# Patient Record
Sex: Female | Born: 1995 | Race: White | Hispanic: No | Marital: Married | Smoking: Never smoker
Health system: Southern US, Community
[De-identification: ages and names within clinical notes are randomized; demographics above are authoritative.]

## PROBLEM LIST (undated history)

## (undated) DIAGNOSIS — S82143A Displaced bicondylar fracture of unspecified tibia, initial encounter for closed fracture: Secondary | ICD-10-CM

## (undated) DIAGNOSIS — S32810A Multiple fractures of pelvis with stable disruption of pelvic ring, initial encounter for closed fracture: Secondary | ICD-10-CM

## (undated) DIAGNOSIS — D649 Anemia, unspecified: Secondary | ICD-10-CM

## (undated) DIAGNOSIS — K219 Gastro-esophageal reflux disease without esophagitis: Secondary | ICD-10-CM

## (undated) DIAGNOSIS — S32309A Unspecified fracture of unspecified ilium, initial encounter for closed fracture: Secondary | ICD-10-CM

## (undated) DIAGNOSIS — K5900 Constipation, unspecified: Secondary | ICD-10-CM

## (undated) DIAGNOSIS — S92309A Fracture of unspecified metatarsal bone(s), unspecified foot, initial encounter for closed fracture: Secondary | ICD-10-CM

## (undated) DIAGNOSIS — S83419A Sprain of medial collateral ligament of unspecified knee, initial encounter: Secondary | ICD-10-CM

## (undated) DIAGNOSIS — S83005A Unspecified dislocation of left patella, initial encounter: Secondary | ICD-10-CM

## (undated) HISTORY — DX: Gastro-esophageal reflux disease without esophagitis: K21.9

## (undated) HISTORY — DX: Anemia, unspecified: D64.9

---
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
## 2014-04-18 ENCOUNTER — Encounter (HOSPITAL_COMMUNITY): Payer: Self-pay | Admitting: Emergency Medicine

## 2014-04-18 ENCOUNTER — Emergency Department (INDEPENDENT_AMBULATORY_CARE_PROVIDER_SITE_OTHER)
Admission: EM | Admit: 2014-04-18 | Discharge: 2014-04-18 | Disposition: A | Discharge status: Home / Self Care | Payer: PRIVATE HEALTH INSURANCE | Source: Home / Self Care | Attending: Family Medicine | Admitting: Family Medicine

## 2014-04-18 ENCOUNTER — Ambulatory Visit (HOSPITAL_COMMUNITY): Payer: 59 | Attending: Orthopedic Surgery

## 2014-04-18 DIAGNOSIS — M799 Soft tissue disorder, unspecified: Secondary | ICD-10-CM | POA: Insufficient documentation

## 2014-04-18 DIAGNOSIS — L02216 Cutaneous abscess of umbilicus: Secondary | ICD-10-CM

## 2014-04-18 DIAGNOSIS — L03319 Cellulitis of trunk, unspecified: Secondary | ICD-10-CM

## 2014-04-18 DIAGNOSIS — L02219 Cutaneous abscess of trunk, unspecified: Secondary | ICD-10-CM

## 2014-04-18 LAB — POCT RAPID STREP A: Streptococcus, Group A Screen (Direct): NEGATIVE

## 2014-04-18 NOTE — ED Notes (Signed)
Patient returned to [facility] from [facility]s [facility]

## 2014-04-18 NOTE — Discharge Instructions (Signed)
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
## 2014-04-18 NOTE — ED Notes (Signed)
Sore throat and cough.  S/t for a week, cough for 2 days.   One year ago had a belly button ring placed.  2 days later, ring removed and not put back in since then.  Patient noted scar tissue.  approx one month ago started having swelling.  Parent admits to poking this area, but no drainage.  Area has continued to swell and hurt
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
## 2014-04-19 NOTE — ED Provider Notes (Signed)
Medical screening examination/treatment/procedure(s) were performed by a resident physician or non-physician practitioner and as the supervising physician I was immediately available for consultation/collaboration.  Evan Corey, MD    Evan S Corey, MD 04/19/14 1436 

## 2014-04-20 LAB — CULTURE, GROUP A STREP

## 2014-04-21 LAB — CULTURE, ROUTINE-ABSCESS: Culture: NO GROWTH

## 2015-01-16 ENCOUNTER — Ambulatory Visit: Payer: Self-pay | Admitting: Family Medicine

## 2015-01-16 LAB — URINALYSIS, COMPLETE
GLUCOSE, UR: NEGATIVE
LEUKOCYTE ESTERASE: NEGATIVE
NITRITE: NEGATIVE
Ph: 5.5 (ref 5.0–8.0)
Protein: NEGATIVE
Specific Gravity: 1.025 (ref 1.000–1.030)

## 2015-01-16 LAB — PREGNANCY, URINE: PREGNANCY TEST, URINE: NEGATIVE m[IU]/mL

## 2015-02-27 ENCOUNTER — Encounter: Payer: Self-pay | Admitting: Physician Assistant

## 2015-03-07 ENCOUNTER — Encounter: Payer: Self-pay | Admitting: Physician Assistant

## 2015-03-07 ENCOUNTER — Other Ambulatory Visit: Payer: Self-pay | Admitting: Physician Assistant

## 2015-03-07 ENCOUNTER — Other Ambulatory Visit (INDEPENDENT_AMBULATORY_CARE_PROVIDER_SITE_OTHER): Payer: PRIVATE HEALTH INSURANCE

## 2015-03-07 ENCOUNTER — Ambulatory Visit (INDEPENDENT_AMBULATORY_CARE_PROVIDER_SITE_OTHER): Payer: PRIVATE HEALTH INSURANCE | Admitting: Physician Assistant

## 2015-03-07 VITALS — BP 90/68 | HR 80 | Ht 66.0 in | Wt 120.2 lb

## 2015-03-07 DIAGNOSIS — R110 Nausea: Secondary | ICD-10-CM

## 2015-03-07 DIAGNOSIS — K219 Gastro-esophageal reflux disease without esophagitis: Secondary | ICD-10-CM | POA: Diagnosis not present

## 2015-03-07 DIAGNOSIS — K5900 Constipation, unspecified: Secondary | ICD-10-CM

## 2015-03-07 DIAGNOSIS — D509 Iron deficiency anemia, unspecified: Secondary | ICD-10-CM

## 2015-03-07 DIAGNOSIS — K5909 Other constipation: Secondary | ICD-10-CM

## 2015-03-07 DIAGNOSIS — R634 Abnormal weight loss: Secondary | ICD-10-CM

## 2015-03-07 DIAGNOSIS — E559 Vitamin D deficiency, unspecified: Secondary | ICD-10-CM

## 2015-03-07 LAB — SEDIMENTATION RATE: Sed Rate: 12 mm/hr (ref 0–22)

## 2015-03-07 LAB — HIGH SENSITIVITY CRP: CRP HIGH SENSITIVITY: 0.99 mg/L (ref 0.000–5.000)

## 2015-03-07 MED ORDER — PANTOPRAZOLE SODIUM 40 MG PO TBEC: 40.0000 mg | DELAYED_RELEASE_TABLET | Freq: Every day | ORAL | Status: DC
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
## 2015-03-07 NOTE — Patient Instructions (Addendum)
Please go to the basement level to have your labs drawn and Stool test kit.   Continue Miralax, take 1-2 doses daily. We sent a prescription for Pantoprazole sodium 40 mg to  [facility].   You have been scheduled for an endoscopy. Please follow written instructions given to you at your visit today. If you use inhalers (even only as needed), please bring them with you on the day of your procedure. Your physician has requested that you go to www.startemmi.com and enter the access code given to you at your visit today. This web site gives a general overview about your procedure. However, you should still follow specific instructions given to you by our office regarding your preparation for the procedure.

## 2015-03-07 NOTE — Progress Notes (Signed)
Reviewed and agree with management plan.  Trysten Berti T. Edina Winningham, MD FACG 

## 2015-03-08 ENCOUNTER — Other Ambulatory Visit: Payer: Self-pay | Admitting: *Deleted

## 2015-03-08 ENCOUNTER — Other Ambulatory Visit: Payer: Self-pay

## 2015-03-08 ENCOUNTER — Other Ambulatory Visit (INDEPENDENT_AMBULATORY_CARE_PROVIDER_SITE_OTHER): Payer: PRIVATE HEALTH INSURANCE

## 2015-03-08 ENCOUNTER — Other Ambulatory Visit: Payer: Self-pay | Admitting: Gastroenterology

## 2015-03-08 ENCOUNTER — Encounter: Payer: Self-pay | Admitting: Gastroenterology

## 2015-03-08 ENCOUNTER — Ambulatory Visit (AMBULATORY_SURGERY_CENTER): Payer: PRIVATE HEALTH INSURANCE | Admitting: Gastroenterology

## 2015-03-08 VITALS — BP 112/57 | HR 83 | Temp 98.3°F | Resp 16 | Ht 66.0 in | Wt 120.0 lb

## 2015-03-08 DIAGNOSIS — R197 Diarrhea, unspecified: Secondary | ICD-10-CM | POA: Diagnosis not present

## 2015-03-08 DIAGNOSIS — R634 Abnormal weight loss: Secondary | ICD-10-CM | POA: Diagnosis not present

## 2015-03-08 DIAGNOSIS — K900 Celiac disease: Secondary | ICD-10-CM

## 2015-03-08 DIAGNOSIS — D509 Iron deficiency anemia, unspecified: Secondary | ICD-10-CM

## 2015-03-08 DIAGNOSIS — K219 Gastro-esophageal reflux disease without esophagitis: Secondary | ICD-10-CM | POA: Diagnosis not present

## 2015-03-08 DIAGNOSIS — R112 Nausea with vomiting, unspecified: Secondary | ICD-10-CM | POA: Diagnosis not present

## 2015-03-08 LAB — H. PYLORI ANTIBODY, IGG: H Pylori IgG: NEGATIVE

## 2015-03-08 LAB — CELIAC PANEL 10
ENDOMYSIAL SCREEN: NEGATIVE
GLIADIN IGG: 2 U (ref <20)
Gliadin IgA: 5 Units (ref <20)
IGA: 143 mg/dL (ref 69–380)
TISSUE TRANSGLUTAMINASE AB, IGA: 1 U/mL (ref <4)
Tissue Transglut Ab: 2 U/mL (ref <6)

## 2015-03-08 MED ORDER — SODIUM CHLORIDE 0.9 % IV SOLN: 500.0000 mL | INTRAVENOUS | Status: DC

## 2015-03-08 NOTE — Progress Notes (Signed)
Called to room to assist during endoscopic procedure.  Patient ID and intended procedure confirmed with present staff. Received instructions for my participation in the procedure from the performing physician.  

## 2015-03-08 NOTE — Patient Instructions (Addendum)
YOU HAD AN ENDOSCOPIC PROCEDURE TODAY AT THE [facility]:   Refer to the procedure report that was given to you for any specific questions about what was found during the examination.  If the procedure report does not answer your questions, please call your gastroenterologist to clarify.  If you requested that your care partner not be given the details of your procedure findings, then the procedure report has been included in a sealed envelope for you to review at your convenience later.  YOU SHOULD EXPECT: Some feelings of bloating in the abdomen. Passage of more gas than usual.  Walking can help get rid of the air that was put into your GI tract during the procedure and reduce the bloating. If you had a lower endoscopy (such as a colonoscopy or flexible sigmoidoscopy) you may notice spotting of blood in your stool or on the toilet paper. If you underwent a bowel prep for your procedure, you may not have a normal bowel movement for a few days.  Please Note:  You might notice some irritation and congestion in your nose or some drainage.  This is from the oxygen used during your procedure.  There is no need for concern and it should clear up in a day or so.  SYMPTOMS TO REPORT IMMEDIATELY:   Following upper endoscopy (EGD)  Vomiting of blood or coffee ground material  New chest pain or pain under the shoulder blades  Painful or persistently difficult swallowing  New shortness of breath  Fever of 100F or higher  Black, tarry-looking stools  For urgent or emergent issues, a gastroenterologist can be reached at any hour by calling [phone].   DIET: Your first meal following the procedure should be a small meal and then it is ok to progress to your normal diet. Heavy or fried foods are harder to digest and may make you feel nauseous or bloated.  Likewise, meals heavy in dairy and vegetables can increase bloating.  Drink plenty of fluids but you should avoid alcoholic beverages for  24 hours.  ACTIVITY:  You should plan to take it easy for the rest of today and you should NOT DRIVE or use heavy machinery until tomorrow (because of the sedation medicines used during the test).    FOLLOW UP: Our staff will call the number listed on your records the next business day following your procedure to check on you and address any questions or concerns that you may have regarding the information given to you following your procedure. If we do not reach you, we will leave a message.  However, if you are feeling well and you are not experiencing any problems, there is no need to return our call.  We will assume that you have returned to your regular daily activities without incident.  If any biopsies were taken you will be contacted by phone or by letter within the next 1-3 weeks.  Please call us at [phone] if you have not heard about the biopsies in 3 weeks.    SIGNATURES/CONFIDENTIALITY: You and/or your care partner have signed paperwork which will be entered into your electronic medical record.  These signatures attest to the fact that that the information above on your After Visit Summary has been reviewed and is understood.  Full responsibility of the confidentiality of this discharge information lies with you and/or your care-partner.  Gastritis-handout given  Office follow-up in 2-3 weeks, office will call to schedule appointment.  Continue protonix and iron.

## 2015-03-08 NOTE — Op Note (Signed)
[facility] [address] [place], [id number]   ENDOSCOPY PROCEDURE REPORT  PATIENT: Megan [name], Megan [name]  MR#: [id number] BIRTHDATE: 12-Aug-1996 , 18  yrs. old GENDER: female ENDOSCOPIST: Meryl DareMalcolm T Nyzier Boivin, MD, [facility] REFERRED BY:  Baruch GoutyMelinda Lada, MD PROCEDURE DATE:  03/08/2015 PROCEDURE:  EGD w/ biopsy ASA CLASS:     Class II INDICATIONS:  iron deficiency anemia, nausea, vomiting, history of esophageal reflux, and weight loss. MEDICATIONS: Monitored anesthesia care and Propofol 200 mg IV TOPICAL ANESTHETIC: none DESCRIPTION OF PROCEDURE: After the risks benefits and alternatives of the procedure were thoroughly explained, informed consent was obtained.  The LB [id number] endoscope was introduced through the mouth and advanced to the second portion of the duodenum , Without limitations.  The instrument was slowly withdrawn as the mucosa was fully examined.    DUODENUM: Moderate erosive duodenitis was found in the duodenal bulb.  Multiple random biopsies were performed in bulb and descending duodenum. STOMACH: Mild gastritis was found in the gastric body.  Multiple biopsies were performed.   The stomach otherwise appeared normal.  ESOPHAGUS: The mucosa of the esophagus appeared normal.  Retroflexed views revealed no abnormalities.     The scope was then withdrawn from the patient and the procedure completed.  COMPLICATIONS: There were no immediate complications.  ENDOSCOPIC IMPRESSION: 1.   Erosive duodenitis in the duodenal bulb 2.   Mild gastritis in the gastric body; multiple biopsies performed  3.   The EGD otherwise appeared normal  RECOMMENDATIONS: 1.  Anti-reflux regimen 2.  Await pathology 3.  Continue PPI qam and Fe daily 4.  OP follow-up in 2-3 weeks.  eSigned:  Meryl DareMalcolm T Araly Kaas, MD, [facility] 03/08/2015 10:28 AM   [C

## 2015-03-08 NOTE — Progress Notes (Signed)
Report to PACU, RN, vss, BBS= Clear.  

## 2015-03-09 ENCOUNTER — Telehealth: Payer: Self-pay | Admitting: *Deleted

## 2015-03-09 NOTE — Telephone Encounter (Signed)
  Follow up Call-  Call back number 03/08/2015  Post procedure Call Back phone  # [phone]  Permission to leave phone message Yes     Patient questions:  Do you have a fever, pain , or abdominal swelling? No. Pain Score  0 *  Have you tolerated food without any problems? Yes.    Have you been able to return to your normal activities? Yes.    Do you have any questions about your discharge instructions: Diet   No. Medications  No. Follow up visit  No.  Do you have questions or concerns about your Care? No.  Actions: * If pain score is 4 or above: No action needed, pain <4.

## 2015-03-14 ENCOUNTER — Encounter: Payer: Self-pay | Admitting: Gastroenterology

## 2015-04-06 ENCOUNTER — Ambulatory Visit: Payer: PRIVATE HEALTH INSURANCE | Admitting: Gastroenterology

## 2015-04-18 DIAGNOSIS — D649 Anemia, unspecified: Secondary | ICD-10-CM | POA: Insufficient documentation

## 2015-04-18 DIAGNOSIS — K219 Gastro-esophageal reflux disease without esophagitis: Secondary | ICD-10-CM | POA: Insufficient documentation

## 2015-04-20 ENCOUNTER — Ambulatory Visit: Payer: Self-pay | Admitting: Family Medicine

## 2015-04-25 ENCOUNTER — Ambulatory Visit (INDEPENDENT_AMBULATORY_CARE_PROVIDER_SITE_OTHER): Payer: PRIVATE HEALTH INSURANCE | Admitting: Family Medicine

## 2015-04-25 ENCOUNTER — Encounter: Payer: Self-pay | Admitting: Family Medicine

## 2015-04-25 VITALS — BP 113/73 | HR 86 | Temp 98.3°F | Wt 119.0 lb

## 2015-04-25 DIAGNOSIS — K2990 Gastroduodenitis, unspecified, without bleeding: Secondary | ICD-10-CM | POA: Diagnosis not present

## 2015-04-25 DIAGNOSIS — J069 Acute upper respiratory infection, unspecified: Secondary | ICD-10-CM

## 2015-04-25 DIAGNOSIS — D649 Anemia, unspecified: Secondary | ICD-10-CM | POA: Diagnosis not present

## 2015-04-25 MED ORDER — MUPIROCIN CALCIUM 2 % EX CREA: 1.0000 "application " | TOPICAL_CREAM | Freq: Two times a day (BID) | CUTANEOUS | Status: DC

## 2015-04-25 NOTE — Assessment & Plan Note (Signed)
Recent egd by GI; continue PPI; avoid triggers, reviewed some of those with her; request labs done

## 2015-04-25 NOTE — Progress Notes (Signed)
BP 113/73 mmHg  Pulse 86  Temp(Src) 98.3 F (36.8 C)  Wt 119 lb (53.978 kg)  SpO2 99%  LMP 03/26/2015 (Approximate)   Subjective:    Patient ID: Megan [name], female    DOB: 02/28/1996, 19 y.o.   MRN: [id number]  HPI: Megan FriendlyCourtney Adrien [name] is a 19 y.o. female  Chief Complaint  Patient presents with  . Anemia  . Sinusitis   She went to the GI doctor and they did an upper endoscopy, did biopsies and okay; but did find gastritis and duodenitis; they did not know why; she goes back in August, first week of August; they thought acid reflux; she was started on Protonix and it's been helping now after several days; they gave her a list of foods to limit; not stressed  Her weight is up three pounds; not as tired, feeling better; moving bowels okay; appetite is good  She had some sinus issues last week; left ear was burning last week; waking up with crusty eyes; sinuses hurting too; beach last week; was in the water; hurts swollen in the cheeks, under the ears; no really sore throat; no meds; no fevers  Relevant past medical, surgical, family and social history reviewed and updated as indicated. Interim medical history since our last visit reviewed. Allergies and medications reviewed and updated.  Review of Systems  Per HPI unless specifically indicated above     Objective:    BP 113/73 mmHg  Pulse 86  Temp(Src) 98.3 F (36.8 C)  Wt 119 lb (53.978 kg)  SpO2 99%  LMP 03/26/2015 (Approximate)  Wt Readings from Last 3 Encounters:  04/25/15 119 lb (53.978 kg) (32 %*, Z = -0.47)  02/27/15 118 lb (53.524 kg) (31 %*, Z = -0.51)  03/08/15 120 lb (54.432 kg) (35 %*, Z = -0.40)   * Growth percentiles are based on CDC 2-20 Years data.    Physical Exam  Constitutional: She appears well-developed and well-nourished. No distress.  HENT:  Head: Normocephalic.  Right Ear: No drainage. Tympanic membrane is not perforated, not erythematous, not retracted and not bulging.   Left Ear: No drainage. Tympanic membrane is not perforated, not erythematous, not retracted and not bulging.  Nose: No mucosal edema or rhinorrhea. No epistaxis.  Eyes: EOM are normal. Right eye exhibits no discharge. Left eye exhibits no discharge.  Neck: Normal range of motion. No thyromegaly present.  Cardiovascular: Normal rate, regular rhythm and normal heart sounds.   Pulmonary/Chest: Effort normal and breath sounds normal.  Abdominal: Soft. Bowel sounds are normal. She exhibits no distension. There is no tenderness. There is no guarding (but ticklish).  Lymphadenopathy:    She has no cervical adenopathy.  Neurological: She is alert.  Skin: Skin is warm and dry. No pallor.    Results for orders placed or performed in visit on 03/08/15  H. pylori antibody, IgG  Result Value Ref Range   H Pylori IgG Negative Negative      Assessment & Plan:   Problem List Items Addressed This Visit      Digestive   Gastritis and duodenitis - Primary    Recent egd by GI; continue PPI; avoid triggers, reviewed some of those with her; request labs done        Other   Anemia    Labs just done by GI per patient; request copy for review and record       Other Visit Diagnoses    Upper respiratory infection  suspect viral; rest and hydration and vit C    Relevant Medications    mupirocin cream (BACTROBAN) 2 %        Follow up plan: Return if symptoms worsen or fail to improve.

## 2015-04-25 NOTE — Assessment & Plan Note (Signed)
Labs just done by GI per patient; request copy for review and record

## 2015-04-25 NOTE — Patient Instructions (Addendum)
Please request labs from the GI doctor so I can review those Do try to limit acidic foods, caffeinated drinks, coffee, peppermint, tea, chocolate, spicy foods Do use the antibiotic ointment inside each nostril twice a day for five days or longer if needed Try vitamin C (orange juice if not diabetic or vitamin C tablets) and drink green tea to help your immune system during your illness Get plenty of rest and hydration Call to see if antibiotics are indicated if still congested and symptomatic after 10 days, or if you develop fever

## 2015-05-21 ENCOUNTER — Ambulatory Visit (INDEPENDENT_AMBULATORY_CARE_PROVIDER_SITE_OTHER): Payer: PRIVATE HEALTH INSURANCE | Admitting: Gastroenterology

## 2015-05-21 ENCOUNTER — Encounter: Payer: Self-pay | Admitting: Gastroenterology

## 2015-05-21 ENCOUNTER — Telehealth: Payer: Self-pay | Admitting: Family Medicine

## 2015-05-21 VITALS — BP 88/60 | HR 76 | Ht 67.0 in | Wt 122.1 lb

## 2015-05-21 DIAGNOSIS — D649 Anemia, unspecified: Secondary | ICD-10-CM

## 2015-05-21 DIAGNOSIS — K2980 Duodenitis without bleeding: Secondary | ICD-10-CM | POA: Diagnosis not present

## 2015-05-21 DIAGNOSIS — K219 Gastro-esophageal reflux disease without esophagitis: Secondary | ICD-10-CM

## 2015-05-21 NOTE — Progress Notes (Signed)
    History of Present Illness: This is a 19 year old female returning in follow-up for iron deficiency anemia, GERD and erosive gastritis. Recent endoscopy with biopsies was performed. Gastric and duodenal biopsies were unremarkable. She has no gastrointestinal complaints.  Current Medications, Allergies, Past Medical History, Past Surgical History, Family History and Social History were reviewed in [employer] record.  Physical Exam: General: Well developed , well nourished, no acute distress Head: Normocephalic and atraumatic Eyes:  sclerae anicteric, EOMI Ears: Normal auditory acuity Mouth: No deformity or lesions Lungs: Clear throughout to auscultation Heart: Regular rate and rhythm; no murmurs, rubs or bruits Abdomen: Soft, non tender and non distended. No masses, hepatosplenomegaly or hernias noted. Normal Bowel sounds Musculoskeletal: Symmetrical with no gross deformities  Extremities: No clubbing, cyanosis, edema or deformities noted Neurological: Alert oriented x 4, grossly nonfocal Psychological:  Alert and cooperative. Normal mood and affect  Assessment and Recommendations:  1. GERD and erosive gastritis. Minimize aspirin and NSAID usage. Continue pantoprazole 40 mg daily and standard antireflux measures.  2. Iron deficiency anemia. No evidence for celiac disease or any other malabsorptive process. Continue iron supplements and follow-up with PCP.  15 minutes of face-to-face time spent with the patient. Greater than 50% of time was spent counseling and coordinating care.

## 2015-05-21 NOTE — Telephone Encounter (Signed)
After looking back in [facility], Dr. Sherie Don had wanted her to come in for CBC and Ferritin in July. Advised patient to continue iron for now and she will come in for labs on Friday and we can see how her iron level is doing and make any changes necessary.

## 2015-05-21 NOTE — Patient Instructions (Addendum)
Stay on your pantoprazole daily.   Thank you for choosing me and [facility].  Venita Lick. Pleas Koch., MD., Clementeen Graham  cc: Baruch Gouty, MD

## 2015-05-21 NOTE — Telephone Encounter (Signed)
Pt called in and needed to know about how long she should take iron supp.

## 2015-05-25 ENCOUNTER — Encounter: Payer: Self-pay | Admitting: Family Medicine

## 2015-05-25 ENCOUNTER — Other Ambulatory Visit: Payer: PRIVATE HEALTH INSURANCE

## 2015-05-25 DIAGNOSIS — D649 Anemia, unspecified: Secondary | ICD-10-CM

## 2015-05-25 LAB — CBC WITH DIFFERENTIAL/PLATELET
HEMATOCRIT: 38 % (ref 34.0–46.6)
HEMOGLOBIN: 13 g/dL (ref 11.1–15.9)
LYMPHS ABS: 1.7 10*3/uL (ref 0.7–3.1)
LYMPHS: 46 %
MCH: 28.4 pg (ref 26.6–33.0)
MCHC: 34.2 g/dL (ref 31.5–35.7)
MCV: 83 fL (ref 79–97)
MID (Absolute): 0.3 10*3/uL (ref 0.1–1.6)
MID: 8 %
Neutrophils Absolute: 1.7 10*3/uL (ref 1.4–7.0)
Neutrophils: 47 %
Platelets: 164 10*3/uL (ref 150–379)
RBC: 4.58 x10E6/uL (ref 3.77–5.28)
RDW: 15.8 % — ABNORMAL HIGH (ref 12.3–15.4)
WBC: 3.7 10*3/uL (ref 3.4–10.8)

## 2015-05-26 LAB — FERRITIN: Ferritin: 21 ng/mL (ref 15–77)

## 2015-07-09 ENCOUNTER — Other Ambulatory Visit: Payer: Self-pay | Admitting: Physician Assistant

## 2015-07-19 ENCOUNTER — Ambulatory Visit (INDEPENDENT_AMBULATORY_CARE_PROVIDER_SITE_OTHER): Payer: PRIVATE HEALTH INSURANCE | Admitting: Family Medicine

## 2015-07-19 ENCOUNTER — Encounter: Payer: Self-pay | Admitting: Family Medicine

## 2015-07-19 VITALS — BP 107/67 | HR 68 | Temp 97.4°F | Ht 66.75 in | Wt 126.0 lb

## 2015-07-19 DIAGNOSIS — J3081 Allergic rhinitis due to animal (cat) (dog) hair and dander: Secondary | ICD-10-CM

## 2015-07-19 DIAGNOSIS — F411 Generalized anxiety disorder: Secondary | ICD-10-CM | POA: Insufficient documentation

## 2015-07-19 DIAGNOSIS — J309 Allergic rhinitis, unspecified: Secondary | ICD-10-CM | POA: Insufficient documentation

## 2015-07-19 MED ORDER — ESCITALOPRAM OXALATE 10 MG PO TABS: 10.0000 mg | ORAL_TABLET | Freq: Every day | ORAL | Status: DC

## 2015-07-19 MED ORDER — FEXOFENADINE HCL 180 MG PO TABS: 180.0000 mg | ORAL_TABLET | Freq: Every day | ORAL | Status: DC

## 2015-07-19 MED ORDER — MONTELUKAST SODIUM 10 MG PO TABS: 10.0000 mg | ORAL_TABLET | Freq: Every day | ORAL | Status: DC

## 2015-07-19 NOTE — Patient Instructions (Addendum)
Start the new medicines for allergies Stop the Zyrtec and Claritin Start the new medicine for mood Do please seek care right away if any dark thoughts We'll notify you about the allergy testing Return in 3 weeks, but call before then if needed Keep animals OUT of the bedroom and off of the bed  Allergic Rhinitis Allergic rhinitis is when the mucous membranes in the nose respond to allergens. Allergens are particles in the air that cause your body to have an allergic reaction. This causes you to release allergic antibodies. Through a chain of events, these eventually cause you to release histamine into the blood stream. Although meant to protect the body, it is this release of histamine that causes your discomfort, such as frequent sneezing, congestion, and an itchy, runny nose.  CAUSES  Seasonal allergic rhinitis (hay fever) is caused by pollen allergens that may come from grasses, trees, and weeds. Year-round allergic rhinitis (perennial allergic rhinitis) is caused by allergens such as house dust mites, pet dander, and mold spores.  SYMPTOMS   Nasal stuffiness (congestion).  Itchy, runny nose with sneezing and tearing of the eyes. DIAGNOSIS  Your health care provider can help you determine the allergen or allergens that trigger your symptoms. If you and your health care provider are unable to determine the allergen, skin or blood testing may be used. TREATMENT  Allergic rhinitis does not have a cure, but it can be controlled by:  Medicines and allergy shots (immunotherapy).  Avoiding the allergen. Hay fever may often be treated with antihistamines in pill or nasal spray forms. Antihistamines block the effects of histamine. There are over-the-counter medicines that may help with nasal congestion and swelling around the eyes. Check with your health care provider before taking or giving this medicine.  If avoiding the allergen or the medicine prescribed do not work, there are many new  medicines your health care provider can prescribe. Stronger medicine may be used if initial measures are ineffective. Desensitizing injections can be used if medicine and avoidance does not work. Desensitization is when a patient is given ongoing shots until the body becomes less sensitive to the allergen. Make sure you follow up with your health care provider if problems continue. HOME CARE INSTRUCTIONS It is not possible to completely avoid allergens, but you can reduce your symptoms by taking steps to limit your exposure to them. It helps to know exactly what you are allergic to so that you can avoid your specific triggers. SEEK MEDICAL CARE IF:   You have a fever.  You develop a cough that does not stop easily (persistent).  You have shortness of breath.  You start wheezing.  Symptoms interfere with normal daily activities. Document Released: 07/01/2001 Document Revised: 10/11/2013 Document Reviewed: 06/13/2013 [facility] Patient Information 2015 [place], [place]. This information is not intended to replace advice given to you by your health care provider. Make sure you discuss any questions you have with your health care provider. Generalized Anxiety Disorder Generalized anxiety disorder (GAD) is a mental disorder. It interferes with life functions, including relationships, work, and school. GAD is different from normal anxiety, which everyone experiences at some point in their lives in response to specific life events and activities. Normal anxiety actually helps [place] prepare for and get through these life events and activities. Normal anxiety goes away after the event or activity is over.  GAD causes anxiety that is not necessarily related to specific events or activities. It also causes excess anxiety in proportion to specific  events or activities. The anxiety associated with GAD is also difficult to control. GAD can vary from mild to severe. People with severe GAD can have intense waves of  anxiety with physical symptoms (panic attacks).  SYMPTOMS The anxiety and worry associated with GAD are difficult to control. This anxiety and worry are related to many life events and activities and also occur more days than not for 6 months or longer. People with GAD also have three or more of the following symptoms (one or more in children):  Restlessness.   Fatigue.  Difficulty concentrating.   Irritability.  Muscle tension.  Difficulty sleeping or unsatisfying sleep. DIAGNOSIS GAD is diagnosed through an assessment by your health care provider. Your health care provider will ask you questions aboutyour mood,physical symptoms, and events in your life. Your health care provider may ask you about your medical history and use of alcohol or drugs, including prescription medicines. Your health care provider may also do a physical exam and blood tests. Certain medical conditions and the use of certain substances can cause symptoms similar to those associated with GAD. Your health care provider may refer you to a mental health specialist for further evaluation. TREATMENT The following therapies are usually used to treat GAD:   Medication. Antidepressant medication usually is prescribed for long-term daily control. Antianxiety medicines may be added in severe cases, especially when panic attacks occur.   Talk therapy (psychotherapy). Certain types of talk therapy can be helpful in treating GAD by providing support, education, and guidance. A form of talk therapy called cognitive behavioral therapy can teach you healthy ways to think about and react to daily life events and activities.  Stress managementtechniques. These include yoga, meditation, and exercise and can be very helpful when they are practiced regularly. A mental health specialist can help determine which treatment is best for you. Some people see improvement with one therapy. However, other people require a combination of  therapies. Document Released: 01/31/2013 Document Revised: 02/20/2014 Document Reviewed: 01/31/2013 [facility] Patient Information 2015 [place], [place]. This information is not intended to replace advice given to you by your health care provider. Make sure you discuss any questions you have with your health care provider.

## 2015-07-19 NOTE — Assessment & Plan Note (Signed)
New problem for [place], worsening for patient; she has tried to different anti-histamines which started to work, then quit being effective; will stop that and start fexofenadine plus montelukast; allergy testing ordered to include cat dander; consider allergy shots in the future, as she is pre-vet; two prescriptions sent; considered nasal spray as well, but she has some mucosal irritation right now, so will hold on that (don't want to cause nosebleeds); f/u in 3 weeks

## 2015-07-19 NOTE — Assessment & Plan Note (Signed)
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

## 2015-07-19 NOTE — Progress Notes (Signed)
BP 107/67 mmHg  Pulse 68  Temp(Src) 97.4 F (36.3 C)  Ht 5' 6.75" (1.695 m)  Wt 126 lb (57.153 kg)  BMI 19.89 kg/m2  SpO2 99%  LMP 07/08/2015 (Approximate)   Subjective:    Patient ID: Megan [name], female    DOB: 1996-02-12, 19 y.o.   MRN: [id number]  HPI: Megan [name] is a 19 y.o. female  Chief Complaint  Patient presents with  . Anxiety    She has never been diagnosed with it before, but states her sister and grandmother both have it.  . Allergic Reaction    She thinks she is allergic to cats, she has been trying OTC meds, but they don't help much.   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; cats and dogs at the clinic; she had outside cats as a child; then brought cat in for a while and sleeping in her bed and she got all stopped up; when she gets scratched, gets itchy and scars Watery and itchy eyes Both antihistamines worked for a while, but both quit No trouble breathing with the cats  She also thinks she has always been kind of anxious; her mother and grandmother and sisters are anxious; this semester is really hard for her she says, taking 18 credit hours; yesterday, she tought about what she needed to do, what to pick up for dinner; felt like what to pick up for dinner, overwhelmed; all days are busy; sleeping off and on, sometimes good and sometimes not; junior year of school, pre-vet; being better now about eating; not allowing herself much time to just goof off; takes sister to school some days, cleans and takes care of so much She has always bitten the skin around her fingernails, trying to not pick and bit; will bleed sometimes;  Relevant past medical, surgical,  family and social history reviewed and updated as indicated. Interim medical history since our last visit reviewed. No personal hx of asthma Seasonal allergies run in the family No family hx of asthma Allergies and medications reviewed and updated.  Review of Systems  Constitutional: Positive for fatigue (taking 18 hours this semester including organic chemistry). Negative for fever and unexpected weight change (eating better).  HENT: Positive for sneezing (after cats). Negative for nosebleeds and rhinorrhea.   Eyes: Positive for discharge (after cats) and itching (after cats).  Respiratory: Positive for shortness of breath (with panic and stress, panicky about what to get for dinner last night).   Musculoskeletal: Negative for joint swelling and arthralgias.  Skin: Negative for rash.  Allergic/Immunologic: Positive for environmental allergies.  Neurological: Positive for headaches (after working, thinks it is cats).  Psychiatric/Behavioral: The patient is nervous/anxious (full panic attack a few weeks ago; crying and couldn't calm down).   Per HPI unless specifically indicated above     Objective:    BP 107/67 mmHg  Pulse 68  Temp(Src) 97.4 F (36.3 C)  Ht 5' 6.75" (1.695 m)  Wt 126 lb (57.153 kg)  BMI 19.89 kg/m2  SpO2 99%  LMP 07/08/2015 (Approximate)  Wt Readings from Last 3 Encounters:  07/19/15 126 lb (57.153 kg) (46 %*, Z = -0.11)  05/21/15 122 lb 2 oz (55.396 kg) (38 %*, Z = -0.30)  04/25/15 119 lb (53.978 kg) (32 %*, Z = -0.47)   * Growth percentiles are based on CDC 2-20 Years data.    Physical Exam  Constitutional: She appears well-developed and well-nourished. No distress.  HENT:  Head: Normocephalic and atraumatic.  Right Ear: Hearing, tympanic membrane, external ear and ear canal normal. No middle ear effusion.  Left Ear: Hearing, tympanic membrane, external ear and ear canal normal.  No middle ear effusion.  Nose: Mucosal edema (slight, with slight turbinate  hypertrophy on the left>right; slight mucosal irritation), rhinorrhea (clear) and septal deviation (slight deviation to the left) present.  Mouth/Throat: Uvula is midline, oropharynx is clear and moist and mucous membranes are normal. Mucous membranes are not dry. No dental caries. No posterior oropharyngeal edema or posterior oropharyngeal erythema.  Eyes: EOM are normal. Right eye exhibits no discharge. Left eye exhibits no discharge. No scleral icterus.  Neck: No thyromegaly present.  Cardiovascular: Normal rate, regular rhythm and normal heart sounds.   No extrasystoles are present.  No murmur heard. Pulmonary/Chest: Effort normal and breath sounds normal. No respiratory distress. She has no wheezes.  Abdominal: She exhibits no distension.  Musculoskeletal: Normal range of motion. She exhibits no edema.  Neurological: She is alert. She displays no tremor. She exhibits normal muscle tone. Gait normal.  Reflex Scores:      Patellar reflexes are 2+ on the right side and 2+ on the left side. Skin: Skin is warm and dry. No rash (no urticaria) noted. She is not diaphoretic. No pallor.  Psychiatric: Her speech is normal and behavior is normal. Judgment and thought content normal. Her mood appears anxious (mildly anxious).  Good eye contact with examiner; appropriate; pleasant and cooperative     Results for orders placed or performed in visit on 05/25/15  CBC With Differential/Platelet  Result Value Ref Range   WBC 3.7 3.4 - 10.8 x10E3/uL   RBC 4.58 3.77 - 5.28 x10E6/uL   Hemoglobin 13.0 11.1 - 15.9 g/dL   Hematocrit 16.1 09.6 - 46.6 %   MCV 83 79 - 97 fL   MCH 28.4 26.6 - 33.0 pg   MCHC 34.2 31.5 - 35.7 g/dL   RDW 04.5 (H) 40.9 - 81.1 %   Platelets 164 150 - 379 x10E3/uL   Neutrophils 47 %   Lymphs 46 %   MID 8 %   Neutrophils Absolute 1.7 1.4 - 7.0 x10E3/uL   Lymphocytes Absolute 1.7 0.7 - 3.1 x10E3/uL   MID (Absolute) 0.3 0.1 - 1.6 X10E3/uL  Ferritin  Result Value Ref Range    Ferritin 21 15 - 77 ng/mL      Assessment & Plan:   Problem List Items Addressed This Visit      Respiratory   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    New problem for [place], worsening for patient; she has tried to different anti-histamines which started to work, then quit being effective; will stop that and start fexofenadine plus montelukast; allergy testing ordered to include cat dander; consider allergy shots in the future, as she is pre-vet; two prescriptions sent; considered nasal spray as well, but she has some mucosal irritation right now, so will hold on that (don't want to cause nosebleeds); f/u in 3 weeks      Relevant Orders   Perennial allergen profile IgE     Other   Generalized anxiety disorder    New problem; GAD-7 form  reviewed; she does sound to have GAD; likely familial component, plus increased stress this semester (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  Orders Placed This Encounter  Procedures  . Perennial allergen profile IgE   Meds ordered this encounter  Medications  . clindamycin (CLEOCIN T) 1 % lotion    Sig:     Refill:  2  . ACZONE 7.5 % GEL    Sig:     Refill:  2  . tretinoin (RETIN-A) 0.05 % cream    Sig:     Refill:  2  . fexofenadine (ALLEGRA ALLERGY) 180 MG tablet    Sig: Take 1 tablet (180 mg total) by mouth daily. If needed for allergy symptoms    Dispense:  30 tablet    Refill:  12  . montelukast (SINGULAIR) 10 MG tablet    Sig: Take 1 tablet (10 mg total) by mouth at bedtime. Best if taken every day    Dispense:  30 tablet    Refill:  12  . escitalopram (LEXAPRO) 10 MG tablet    Sig: Take 1 tablet (10 mg total) by mouth daily. For mood    Dispense:  30 tablet    Refill:  0   An  after-visit summary was printed and given to the patient at check-out.  Please see the patient instructions which may contain other information and recommendations beyond what is mentioned above in the assessment and plan.  Face-to-face time with patient was more than 25 minutes, >50% time spent counseling and coordination of care

## 2015-07-22 LAB — ALLERGEN PROFILE, PERENNIAL ALLERGEN IGE
Alternaria Alternata IgE: <0.1 kU/L
Aureobasidi Pullulans IgE: <0.1 kU/L
Chicken Feathers IgE: <0.1 kU/L
D Pteronyssinus IgE: <0.1 kU/L
Dog Dander IgE: <0.1 kU/L
Duck Feathers IgE: <0.1 kU/L
Goose Feathers IgE: <0.1 kU/L
Setomelanomma Rostrat: <0.1 kU/L
Stemphylium Herbarum IgE: <0.1 kU/L

## 2015-07-23 ENCOUNTER — Encounter: Payer: Self-pay | Admitting: Family Medicine

## 2015-08-10 ENCOUNTER — Encounter: Payer: Self-pay | Admitting: Family Medicine

## 2015-08-10 ENCOUNTER — Ambulatory Visit (INDEPENDENT_AMBULATORY_CARE_PROVIDER_SITE_OTHER): Payer: PRIVATE HEALTH INSURANCE | Admitting: Family Medicine

## 2015-08-10 VITALS — BP 101/64 | HR 68 | Temp 98.9°F | Wt 130.0 lb

## 2015-08-10 DIAGNOSIS — J3089 Other allergic rhinitis: Secondary | ICD-10-CM | POA: Diagnosis not present

## 2015-08-10 DIAGNOSIS — F411 Generalized anxiety disorder: Secondary | ICD-10-CM

## 2015-08-10 MED ORDER — SERTRALINE HCL 50 MG PO TABS: 50.0000 mg | ORAL_TABLET | Freq: Every day | ORAL | Status: DC

## 2015-08-10 NOTE — Assessment & Plan Note (Signed)
Reviewed allergy testing; unusual that she was not reactive to any of those listed; perhaps her allergies are due to a chemical used at the vet office (flea dip, shampoo, etc); regardless, she is feeling better on the medicine
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
## 2015-08-10 NOTE — Patient Instructions (Signed)
Stop Lexapro (escitalopram) and then start the sertraline (it's okay if that happens a few days from now) Just call me in 3-4 weeks with an update, let me know how you are feeling Return in 3 months but sooner if needed

## 2015-08-10 NOTE — Assessment & Plan Note (Signed)
Glad to hear that her anxiety is doing better on the SSRI; since she is having weird dreams, will stay in the SSRI class but switch drugs; new Rx sent; she can switch to the new medicines over the next several days whenever she picks up the new one; call me in 3-4 weeks with an update; no hypomania symptoms on the SSRI; return for f/u in 3 months

## 2015-08-31 ENCOUNTER — Telehealth: Payer: Self-pay | Admitting: Family Medicine

## 2015-08-31 NOTE — Telephone Encounter (Signed)
Pt was calling to let Provider know that the new medication is working good and she was no longer having the dreams she was having.

## 2015-08-31 NOTE — Telephone Encounter (Signed)
Routing to provider  

## 2015-09-03 NOTE — Telephone Encounter (Signed)
Glad to hear it. 

## 2015-10-24 DIAGNOSIS — R300 Dysuria: Secondary | ICD-10-CM | POA: Diagnosis not present

## 2015-11-05 ENCOUNTER — Ambulatory Visit: Payer: PRIVATE HEALTH INSURANCE | Admitting: Family Medicine

## 2015-11-19 ENCOUNTER — Ambulatory Visit: Payer: PRIVATE HEALTH INSURANCE | Admitting: Family Medicine

## 2015-11-26 ENCOUNTER — Encounter: Payer: Self-pay | Admitting: Family Medicine

## 2015-11-26 ENCOUNTER — Ambulatory Visit (INDEPENDENT_AMBULATORY_CARE_PROVIDER_SITE_OTHER): Payer: PRIVATE HEALTH INSURANCE | Admitting: Family Medicine

## 2015-11-26 VITALS — BP 117/71 | HR 76 | Temp 98.1°F | Ht 66.5 in | Wt 146.0 lb

## 2015-11-26 DIAGNOSIS — F411 Generalized anxiety disorder: Secondary | ICD-10-CM

## 2015-11-26 DIAGNOSIS — K2990 Gastroduodenitis, unspecified, without bleeding: Secondary | ICD-10-CM | POA: Diagnosis not present

## 2015-11-26 DIAGNOSIS — M7051 Other bursitis of knee, right knee: Secondary | ICD-10-CM | POA: Diagnosis not present

## 2015-11-26 DIAGNOSIS — K2100 Gastro-esophageal reflux disease with esophagitis, without bleeding: Secondary | ICD-10-CM

## 2015-11-26 DIAGNOSIS — R5382 Chronic fatigue, unspecified: Secondary | ICD-10-CM | POA: Diagnosis not present

## 2015-11-26 DIAGNOSIS — D649 Anemia, unspecified: Secondary | ICD-10-CM

## 2015-11-26 DIAGNOSIS — R5383 Other fatigue: Secondary | ICD-10-CM | POA: Insufficient documentation

## 2015-11-26 MED ORDER — SERTRALINE HCL 100 MG PO TABS: 100.0000 mg | ORAL_TABLET | Freq: Every day | ORAL | Status: DC
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
## 2015-11-26 NOTE — Patient Instructions (Addendum)
Take 1.5 of sertraline daily for 8 days (equals 75 mg) and then take two a day (equals 100 mg) After you run out of hte 50 mg pills, just take one of the 100 mg pills daily  Try turmeric as a natural anti-inflammatory (for pain and arthritis). It comes in capsules where you buy aspirin and fish oil, but also as a spice where you buy pepper and garlic powder.  Try ice topically over the front of the right knee  We'll check labs today  Elevated the head of your bed a little bit Try tums 20 minutes before coffee Follow-up with the gastroenterologist

## 2015-11-26 NOTE — Assessment & Plan Note (Signed)
Check CBC today.  

## 2015-11-26 NOTE — Assessment & Plan Note (Signed)
Not as well controlled with the extra stressors of school, organic chemistry; increase sertraline to 75 mg daily x 8 days, then 100 mg daily; return in 4 weeks; cautioned about risk of manic episodes, and reasons to call

## 2015-11-26 NOTE — Assessment & Plan Note (Signed)
F/u with GI specialist; avoid triggers

## 2015-11-26 NOTE — Assessment & Plan Note (Signed)
F/u with GI specialist; avoid triggers 

## 2015-11-27 LAB — CBC WITH DIFFERENTIAL/PLATELET
BASOS ABS: 0.1 10*3/uL (ref 0.0–0.2)
Basos: 1 %
EOS (ABSOLUTE): 0.1 10*3/uL (ref 0.0–0.4)
Eos: 1 %
Hematocrit: 40.2 % (ref 34.0–46.6)
Hemoglobin: 13.3 g/dL (ref 11.1–15.9)
IMMATURE GRANULOCYTES: 0 %
Immature Grans (Abs): 0 10*3/uL (ref 0.0–0.1)
Lymphocytes Absolute: 2.1 10*3/uL (ref 0.7–3.1)
Lymphs: 42 %
MCH: 28.9 pg (ref 26.6–33.0)
MCHC: 33.1 g/dL (ref 31.5–35.7)
MCV: 87 fL (ref 79–97)
MONOS ABS: 0.4 10*3/uL (ref 0.1–0.9)
Monocytes: 7 %
NEUTROS PCT: 49 %
Neutrophils Absolute: 2.5 10*3/uL (ref 1.4–7.0)
PLATELETS: 213 10*3/uL (ref 150–379)
RBC: 4.61 x10E6/uL (ref 3.77–5.28)
RDW: 13.5 % (ref 12.3–15.4)
WBC: 5.1 10*3/uL (ref 3.4–10.8)

## 2015-11-27 LAB — FERRITIN: FERRITIN: 29 ng/mL (ref 15–150)

## 2015-11-27 LAB — TSH: TSH: 2.62 u[IU]/mL (ref 0.450–4.500)

## 2015-11-27 LAB — VITAMIN D 25 HYDROXY (VIT D DEFICIENCY, FRACTURES): Vit D, 25-Hydroxy: 55.2 ng/mL (ref 30.0–100.0)

## 2015-11-27 LAB — T4, FREE: FREE T4: 1.41 ng/dL (ref 0.82–1.77)

## 2015-12-09 NOTE — Assessment & Plan Note (Signed)
Explained dx; conservative treatment recommended; notify me if not improving

## 2015-12-10 ENCOUNTER — Encounter: Payer: Self-pay | Admitting: Gastroenterology

## 2015-12-10 ENCOUNTER — Ambulatory Visit (INDEPENDENT_AMBULATORY_CARE_PROVIDER_SITE_OTHER): Payer: PRIVATE HEALTH INSURANCE | Admitting: Gastroenterology

## 2015-12-10 VITALS — BP 94/60 | HR 76 | Ht 67.0 in | Wt 145.8 lb

## 2015-12-10 DIAGNOSIS — K2980 Duodenitis without bleeding: Secondary | ICD-10-CM

## 2015-12-10 DIAGNOSIS — K219 Gastro-esophageal reflux disease without esophagitis: Secondary | ICD-10-CM | POA: Diagnosis not present

## 2015-12-10 MED ORDER — PANTOPRAZOLE SODIUM 40 MG PO TBEC: 40.0000 mg | DELAYED_RELEASE_TABLET | Freq: Two times a day (BID) | ORAL | Status: DC

## 2015-12-10 NOTE — Patient Instructions (Signed)
Increase your Protonix to 40 mg twice daily. A new prescription has been sent to your pharmacy.   Patient advised to avoid spicy, acidic, citrus, chocolate, mints, fruit and fruit juices.  Limit the intake of caffeine, alcohol and Soda.  Don't exercise too soon after eating.  Don't lie down within 3-4 hours of eating.  Elevate the head of your bed.  Call back in one month if you do not see a progress in your symptoms.  Thank you for choosing me and [facility].  Venita Lick. Pleas Koch., MD., Clementeen Graham

## 2015-12-10 NOTE — Progress Notes (Signed)
    History of Present Illness: This is a 20 year old female with duodenitis, GERD and resolved iron deficiency anemia. EGD in 02/2015 revealed erosive duodenitis. She states her reflux symptoms were well controlled on daily pantoprazole 11 over the past few weeks she notes frequent postprandial regurgitation. She doesn't eat regular meals and occasionally will overeat at her one large meal per day. Weight has increased 25 lbs since 02/2015.   Current Medications, Allergies, Past Medical History, Past Surgical History, Family History and Social History were reviewed in [employer] record.  Physical Exam: General: Well developed, well nourished, no acute distress Head: Normocephalic and atraumatic Eyes:  sclerae anicteric, EOMI Ears: Normal auditory acuity Mouth: No deformity or lesions Lungs: Clear throughout to auscultation Heart: Regular rate and rhythm; no murmurs, rubs or bruits Abdomen: Soft, non tender and non distended. No masses, hepatosplenomegaly or hernias noted. Normal Bowel sounds Musculoskeletal: Symmetrical with no gross deformities  Pulses:  Normal pulses noted Extremities: No clubbing, cyanosis, edema or deformities noted Neurological: Alert oriented x 4, grossly nonfocal Psychological:  Alert and cooperative. Normal mood and affect  Assessment and Recommendations:  1. GERD with postprandial regurgitation symptoms. Eat 3 smaller meals per day and have snacks in between meals if needed. Increase pantoprazole to 40 mg twice daily. If her symptoms have improved in 1 month she should decrease pantoprazole to once daily. REV in 1 year and prn.

## 2015-12-17 DIAGNOSIS — N921 Excessive and frequent menstruation with irregular cycle: Secondary | ICD-10-CM | POA: Diagnosis not present

## 2015-12-24 ENCOUNTER — Encounter: Payer: Self-pay | Admitting: Family Medicine

## 2015-12-24 ENCOUNTER — Ambulatory Visit: Payer: PRIVATE HEALTH INSURANCE | Admitting: Family Medicine

## 2015-12-24 ENCOUNTER — Ambulatory Visit (INDEPENDENT_AMBULATORY_CARE_PROVIDER_SITE_OTHER): Payer: PRIVATE HEALTH INSURANCE | Admitting: Family Medicine

## 2015-12-24 VITALS — BP 105/69 | HR 69 | Temp 97.0°F | Wt 147.0 lb

## 2015-12-24 DIAGNOSIS — F411 Generalized anxiety disorder: Secondary | ICD-10-CM | POA: Diagnosis not present

## 2015-12-24 DIAGNOSIS — D649 Anemia, unspecified: Secondary | ICD-10-CM

## 2015-12-24 MED ORDER — BUSPIRONE HCL 15 MG PO TABS: ORAL_TABLET | ORAL | Status: DC

## 2015-12-24 MED ORDER — SERTRALINE HCL 50 MG PO TABS: ORAL_TABLET | ORAL | Status: DC

## 2015-12-24 NOTE — Patient Instructions (Addendum)
Taper down on the sertraline to 50 mg daily x 2 weeks, then 25 mg daily x 2 weeks, then stop Start the buspirone Call me with an update in 2-3 weeks, call sooner if needed and I'll see you back in 3 months ([place]) Try to drink 8 glasses of water a day

## 2015-12-24 NOTE — Progress Notes (Signed)
BP 105/69 mmHg  Pulse 69  Temp(Src) 97 F (36.1 C)  Wt 147 lb (66.679 kg)  SpO2 99%  LMP 11/26/2015 (Exact Date)   Subjective:    Patient ID: Megan [name], female    DOB: 07-May-1996, 20 y.o.   MRN: [id number]  HPI: Megan [name] is a 20 y.o. female  Chief Complaint  Patient presents with  . Anxiety    follow up    GAD 7 : Generalized Anxiety Score 12/24/2015 11/26/2015  Nervous, Anxious, on Edge 2 1  Control/stop worrying 1 1  Worry too much - different things 1 1  Trouble relaxing 1 1  Restless 0 0  Easily annoyed or irritable 1 1  Afraid - awful might happen 0 0  Total GAD 7 Score 6 5  Anxiety Difficulty Somewhat difficult Somewhat difficult   She is getting headaches, 2-3 x a week; across the foreahead and back across the temples; these have happened since increasing the medicine; can still function, feels like she would prefer to lay down but still able to do stuff Has tried tylenol and ibuprofen, doesn't help Got nexplanon, not new GAD-7 actually higher than last time No major stressors other than just school 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  Review of Systems Per HPI unless specifically indicated above     Objective:    BP 105/69 mmHg  Pulse 69  Temp(Src) 97 F (36.1 C)  Wt 147 lb (66.679 kg)  SpO2 99%  LMP 11/26/2015 (Exact Date)  Wt Readings from Last 3 Encounters:  12/24/15 147 lb (66.679 kg)  12/10/15 145 lb 12.8 oz (66.134 kg)  11/26/15 146 lb (66.225 kg)    Physical Exam  Constitutional: She appears well-developed and well-nourished. No distress.  HENT:  Head: Normocephalic and atraumatic.  Right Ear: Hearing, tympanic membrane, external ear and ear canal normal.  Left Ear: Hearing, tympanic membrane, external  ear and ear canal normal.  Nose: No mucosal edema or rhinorrhea. Right sinus exhibits no maxillary sinus tenderness and no frontal sinus tenderness. Left sinus exhibits no maxillary sinus tenderness and no frontal sinus tenderness.  Mouth/Throat: Oropharynx is clear and moist and mucous membranes are normal.  Cardiovascular: Normal rate and regular rhythm.   Pulmonary/Chest: Effort normal and breath sounds normal.  Lymphadenopathy:    She has no cervical adenopathy.  Psychiatric: Her mood appears not anxious. Her affect is not blunt and not inappropriate. She is not slowed. Cognition and memory are not impaired. She does not express impulsivity. She does not exhibit a depressed mood. She is attentive.   Results for orders placed or performed in visit on 11/26/15  T4, free  Result Value Ref Range   Free T4 1.41 0.82 - 1.77 ng/dL  VITAMIN D 25 Hydroxy (Vit-D Deficiency, Fractures)  Result Value Ref Range   Vit D, 25-Hydroxy 55.2 30.0 - 100.0 ng/mL  TSH  Result Value Ref Range   TSH 2.620 0.450 - 4.500 uIU/mL  CBC with Differential/Platelet  Result Value Ref Range   WBC 5.1 3.4 - 10.8 x10E3/uL   RBC 4.61 3.77 - 5.28 x10E6/uL   Hemoglobin 13.3 11.1 - 15.9 g/dL   Hematocrit 38.7 56.4 - 46.6 %   MCV 87 79 - 97 fL   MCH 28.9 26.6 - 33.0 pg   MCHC  33.1 31.5 - 35.7 g/dL   RDW 54.013.5 98.112.3 - 19.115.4 %   Platelets 213 150 - 379 x10E3/uL   Neutrophils 49 %   Lymphs 42 %   Monocytes 7 %   Eos 1 %   Basos 1 %   Neutrophils Absolute 2.5 1.4 - 7.0 x10E3/uL   Lymphocytes Absolute 2.1 0.7 - 3.1 x10E3/uL   Monocytes Absolute 0.4 0.1 - 0.9 x10E3/uL   EOS (ABSOLUTE) 0.1 0.0 - 0.4 x10E3/uL   Basophils Absolute 0.1 0.0 - 0.2 x10E3/uL   Immature Granulocytes 0 %   Immature Grans (Abs) 0.0 0.0 - 0.1 x10E3/uL  Ferritin  Result Value Ref Range   Ferritin 29 15 - 150 ng/mL      Assessment & Plan:   Problem List Items Addressed This Visit      Other   Anemia    Last CBC reviewed; anemia has  resolved      Generalized anxiety disorder - Primary    She's having headaches on the sertraline; did not do well with escitalopram; will taper down the SSRI and just use buspirone; instructions typed out in after visit summary; patient to call me with an update in 2-3 weeks instead of coming in for an appointment, and I'll adjust med or change med if needed then          Follow up plan: Return in about 3 months (around 03/25/2016) for anxiety.  Meds ordered this encounter  Medications  . etonogestrel (NEXPLANON) 68 MG IMPL implant    Sig: 1 each by Subdermal route once.  . sertraline (ZOLOFT) 50 MG tablet    Sig: One by mouth daily x two weeks, then one-half of a pill daily x two weeks, then stop    Dispense:  21 tablet    Refill:  0  . busPIRone (BUSPAR) 15 MG tablet    Sig: One-half of a pill by mouth twice a day for one week, then one whole pill twice a day    Dispense:  53 tablet    Refill:  0   An after-visit summary was printed and given to the patient at check-out.  Please see the patient instructions which may contain other information and recommendations beyond what is mentioned above in the assessment and plan.

## 2015-12-24 NOTE — Assessment & Plan Note (Signed)
Last CBC reviewed; anemia has resolved

## 2015-12-24 NOTE — Assessment & Plan Note (Signed)
She's having headaches on the sertraline; did not do well with escitalopram; will taper down the SSRI and just use buspirone; instructions typed out in after visit summary; patient to call me with an update in 2-3 weeks instead of coming in for an appointment, and I'll adjust med or change med if needed then

## 2015-12-31 ENCOUNTER — Ambulatory Visit: Payer: PRIVATE HEALTH INSURANCE | Admitting: Family Medicine

## 2016-01-14 ENCOUNTER — Ambulatory Visit (INDEPENDENT_AMBULATORY_CARE_PROVIDER_SITE_OTHER): Payer: PRIVATE HEALTH INSURANCE | Admitting: Gastroenterology

## 2016-01-14 ENCOUNTER — Encounter: Payer: Self-pay | Admitting: Gastroenterology

## 2016-01-14 VITALS — BP 90/60 | HR 104 | Ht 67.0 in | Wt 150.0 lb

## 2016-01-14 DIAGNOSIS — K219 Gastro-esophageal reflux disease without esophagitis: Secondary | ICD-10-CM

## 2016-01-14 DIAGNOSIS — R194 Change in bowel habit: Secondary | ICD-10-CM

## 2016-01-14 DIAGNOSIS — R1011 Right upper quadrant pain: Secondary | ICD-10-CM | POA: Diagnosis not present

## 2016-01-14 MED ORDER — ESOMEPRAZOLE MAGNESIUM 40 MG PO CPDR: 40.0000 mg | DELAYED_RELEASE_CAPSULE | Freq: Every day | ORAL | Status: DC

## 2016-01-14 NOTE — Patient Instructions (Signed)
Discontinue Protonix.   We have sent the following medications to your pharmacy for you to pick up at your convenience:Nexium.  You have been scheduled for an abdominal ultrasound at [facility] (1st floor of hospital) on 01/17/16 at 11:00am. Please arrive 15 minutes prior to your appointment for registration. Make certain not to have anything to eat or drink 6 hours prior to your appointment. Should you need to reschedule your appointment, please contact radiology at [phone]. This test typically takes about 30 minutes to perform.  Patient advised to avoid spicy, acidic, citrus, chocolate, mints, fruit and fruit juices.  Limit the intake of caffeine, alcohol and Soda.  Don't exercise too soon after eating.  Don't lie down within 3-4 hours of eating.  Elevate the head of your bed.  Thank you for choosing me and  Gastroenterology.  Venita LickMalcolm T. Pleas KochStark, Jr., MD., Clementeen GrahamFACG

## 2016-01-14 NOTE — Progress Notes (Signed)
    History of Present Illness: This is a 20 year old female returning for follow-up with new complaints of intermittent right upper quadrant pain and epigastric pain. She notes that her bowel pattern has changed from bowel movement about once every 2 days to 1-2 bowel movements per day. She does not necessarily associate her pain with bowel movements. She states her reflux symptoms and regurgitation have worsened despite increasing the pantoprazole twice daily. She returned to pantoprazole once daily with no change in symptoms.  Current Medications, Allergies, Past Medical History, Past Surgical History, Family History and Social History were reviewed in [facility] electronic medical record.  Physical Exam: General: Well developed, well nourished, no acute distress Head: Normocephalic and atraumatic Eyes:  sclerae anicteric, EOMI Ears: Normal auditory acuity Mouth: No deformity or lesions Lungs: Clear throughout to auscultation Heart: Regular rate and rhythm; no murmurs, rubs or bruits Abdomen: Soft, non tender and non distended. No masses, hepatosplenomegaly or hernias noted. Normal Bowel sounds Musculoskeletal: Symmetrical with no gross deformities  Pulses:  Normal pulses noted Extremities: No clubbing, cyanosis, edema or deformities noted Neurological: Alert oriented x 4, grossly nonfocal Psychological:  Alert and cooperative. Anxious.   Assessment and Recommendations:  1. GERD. Strictly follow all antireflux measures. Discontinue pantoprazole. Begin Nexium 40 mg daily. Use Tums as needed for breakthrough symptoms.  2. Right upper quadrant pain and epigastric pain associated with a change in bowel habits. Possible IBS. Schedule abdominal ultrasound. If symptoms persist consider a trial of an antispasmodic.

## 2016-01-16 ENCOUNTER — Ambulatory Visit (HOSPITAL_COMMUNITY)
Admission: RE | Admit: 2016-01-16 | Discharge: 2016-01-16 | Disposition: A | Discharge status: Home / Self Care | Payer: PRIVATE HEALTH INSURANCE | Source: Ambulatory Visit | Attending: Gastroenterology | Admitting: Gastroenterology

## 2016-01-16 DIAGNOSIS — R161 Splenomegaly, not elsewhere classified: Secondary | ICD-10-CM | POA: Insufficient documentation

## 2016-01-16 DIAGNOSIS — R1011 Right upper quadrant pain: Secondary | ICD-10-CM | POA: Diagnosis not present

## 2016-01-17 ENCOUNTER — Other Ambulatory Visit: Payer: Self-pay

## 2016-01-17 ENCOUNTER — Telehealth: Payer: Self-pay | Admitting: Gastroenterology

## 2016-01-17 ENCOUNTER — Ambulatory Visit (HOSPITAL_COMMUNITY): Payer: PRIVATE HEALTH INSURANCE

## 2016-01-17 MED ORDER — DICYCLOMINE HCL 10 MG PO CAPS: 10.0000 mg | ORAL_CAPSULE | Freq: Three times a day (TID) | ORAL | Status: DC

## 2016-01-17 NOTE — Telephone Encounter (Signed)
See result note.  

## 2016-01-24 ENCOUNTER — Ambulatory Visit (INDEPENDENT_AMBULATORY_CARE_PROVIDER_SITE_OTHER): Payer: PRIVATE HEALTH INSURANCE | Admitting: Family Medicine

## 2016-01-24 ENCOUNTER — Encounter: Payer: Self-pay | Admitting: Family Medicine

## 2016-01-24 VITALS — BP 108/62 | HR 117 | Temp 99.7°F | Resp 20 | Ht 67.0 in | Wt 147.9 lb

## 2016-01-24 DIAGNOSIS — H65113 Acute and subacute allergic otitis media (mucoid) (sanguinous) (serous), bilateral: Secondary | ICD-10-CM | POA: Diagnosis not present

## 2016-01-24 DIAGNOSIS — H9313 Tinnitus, bilateral: Secondary | ICD-10-CM

## 2016-01-24 DIAGNOSIS — R5383 Other fatigue: Secondary | ICD-10-CM

## 2016-01-24 DIAGNOSIS — R161 Splenomegaly, not elsewhere classified: Secondary | ICD-10-CM

## 2016-01-24 DIAGNOSIS — R05 Cough: Secondary | ICD-10-CM

## 2016-01-24 DIAGNOSIS — R059 Cough, unspecified: Secondary | ICD-10-CM

## 2016-01-24 MED ORDER — AMOXICILLIN-POT CLAVULANATE 875-125 MG PO TABS: 1.0000 | ORAL_TABLET | Freq: Two times a day (BID) | ORAL | Status: DC
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
## 2016-01-25 LAB — CBC
HEMATOCRIT: 39.8 % (ref 34.0–46.6)
HEMOGLOBIN: 13.4 g/dL (ref 11.1–15.9)
MCH: 29.1 pg (ref 26.6–33.0)
MCHC: 33.7 g/dL (ref 31.5–35.7)
MCV: 87 fL (ref 79–97)
Platelets: 233 10*3/uL (ref 150–379)
RBC: 4.6 x10E6/uL (ref 3.77–5.28)
RDW: 13.4 % (ref 12.3–15.4)
WBC: 6.6 10*3/uL (ref 3.4–10.8)

## 2016-01-25 LAB — EPSTEIN-BARR VIRUS VCA ANTIBODY PANEL
EBV Early Antigen Ab, IgG: 32.1 U/mL — ABNORMAL HIGH (ref 0.0–8.9)
EBV NA IgG: 466 U/mL — ABNORMAL HIGH (ref 0.0–17.9)
EBV VCA IgG: 430 U/mL — ABNORMAL HIGH (ref 0.0–17.9)
EBV VCA IgM: <36 U/mL (ref 0.0–35.9)

## 2016-01-28 ENCOUNTER — Ambulatory Visit: Payer: Self-pay | Admitting: Family Medicine

## 2016-02-04 ENCOUNTER — Ambulatory Visit (INDEPENDENT_AMBULATORY_CARE_PROVIDER_SITE_OTHER): Payer: PRIVATE HEALTH INSURANCE | Admitting: Family Medicine

## 2016-02-04 ENCOUNTER — Encounter: Payer: Self-pay | Admitting: Family Medicine

## 2016-02-04 VITALS — BP 112/64 | HR 89 | Temp 98.3°F | Resp 12 | Wt 149.0 lb

## 2016-02-04 DIAGNOSIS — R1011 Right upper quadrant pain: Secondary | ICD-10-CM | POA: Diagnosis not present

## 2016-02-04 DIAGNOSIS — R161 Splenomegaly, not elsewhere classified: Secondary | ICD-10-CM | POA: Insufficient documentation

## 2016-02-04 MED ORDER — BUSPIRONE HCL 15 MG PO TABS: ORAL_TABLET | ORAL | Status: DC

## 2016-02-04 NOTE — Progress Notes (Signed)
BP 112/64 mmHg  Pulse 89  Temp(Src) 98.3 F (36.8 C) (Oral)  Resp 12  Wt 149 lb (67.586 kg)  SpO2 96%  LMP 12/31/2015   Subjective:    Patient ID: Megan [name], female    DOB: 1996-05-31, 20 y.o.   MRN: [id number]  HPI: Megan [name] is a 20 y.o. female  Chief Complaint  Patient presents with  . enlarged spleen    Mono  . Ear Pain    bilateral.  Saw Dr. Tressie Ellis 2 weeks ago was given antibiotic for ear infection.  . Abdominal Pain    onset 3 months. burning sensation in epigastric more on right side with right shoulder pain and regurtitation. Currently see's Megan [name].  Normal U/S   She had an abd US done by her [name] doctor; found to have enlarged spleen; burning pain in stomach and right side; 2-3 months; Dr. Russella Dar did an [place]; happens when her stomach is empty or really full; that's when she notices the pain; one bad episode last night; she was driving and she had to let her sister drive; then pain up in the right shoulder; she just ate and it hurts a little bit, but not nearly as bad as before; another bad episode was 2 weeks ago, bent over to pick up towel and really hurt  She had an ear infection in both ears; she had a mono test and it has come back; feels tired and run down Week of penicillin for her ears; finished it on Thursday; sharp pains on the left, little bit on the right; started on the right; trouble hearing; real difficulty; had low-grade fever and was sick for 3 weeks total; no rash; little bit of sore throat; had mono back in sophomore year or junior year; she has always gotten bruises easily, but nothing more than normal  Having stress with organic chemistry; not doing as well as she usually does in classes; not much desire  Depression screen [facility] 2/9 02/04/2016 01/24/2016  Decreased Interest 0 0  Down, Depressed, Hopeless 0 0  PHQ - 2 Score 0 0    GAD 7 : Generalized Anxiety Score 12/24/2015 11/26/2015  Nervous, Anxious, on Edge 2 1  Control/stop  worrying 1 1  Worry too much - different things 1 1  Trouble relaxing 1 1  Restless 0 0  Easily annoyed or irritable 1 1  Afraid - awful might happen 0 0  Total GAD 7 Score 6 5  Anxiety Difficulty Somewhat difficult Somewhat difficult   Relevant past medical, surgical, family and social history reviewed and updated as indicated. MGGF and MGaunt both had gallbladders removed Interim medical history since our last visit reviewed Allergies and medications reviewed  Review of Systems Per HPI unless specifically indicated above  Reviewed recent labs     Objective:    BP 112/64 mmHg  Pulse 89  Temp(Src) 98.3 F (36.8 C) (Oral)  Resp 12  Wt 149 lb (67.586 kg)  SpO2 96%  LMP 12/31/2015  Wt Readings from Last 3 Encounters:  02/12/16 149 lb (67.586 kg)  02/11/16 148 lb (67.132 kg)  02/04/16 149 lb (67.586 kg)    Physical Exam  Constitutional: She appears well-developed and well-nourished. No distress.  HENT:  Head: Normocephalic and atraumatic.  Right Ear: External ear normal.  Left Ear: External ear normal.  Mouth/Throat: Oropharynx is clear and moist. No oropharyngeal exudate.  Eyes: EOM are normal. No scleral icterus.  Cardiovascular: Normal rate and regular  rhythm.   Pulmonary/Chest: Effort normal and breath sounds normal.  Abdominal: There is tenderness (mild upper abd, spleen tip not palpable).  Musculoskeletal: She exhibits no edema.  Neurological: She is alert.  Skin: No rash noted. No erythema. No pallor.  Psychiatric: She has a normal mood and affect.      Assessment & Plan:   Problem List Items Addressed This Visit      Other   RUQ pain   Relevant Orders   Comprehensive metabolic panel (Completed)   UA/M w/rflx Culture, Routine (Completed)   NM Hepato W/Eject Fract (Completed)   CBC with Differential/Platelet (Completed)   Splenomegaly - Primary   Relevant Orders   Comprehensive metabolic panel (Completed)   Cytomegalovirus antibody, IgG   CMV IgM  (Completed)   CBC with Differential/Platelet (Completed)      Follow up plan: Return in about 1 week (around 02/11/2016) for follow-up.  An after-visit summary was printed and given to the patient at check-out.  Please see the patient instructions which may contain other information and recommendations beyond what is mentioned above in the assessment and plan.  Meds ordered this encounter  Medications  . busPIRone (BUSPAR) 15 MG tablet    Sig: One-half of a pill by mouth every morning, and then one whole pill every late afternoon or early evening    Dispense:  45 tablet    Refill:  3    Orders Placed This Encounter  Procedures  . Microscopic Examination  . NM Hepato W/Eject Fract  . Comprehensive metabolic panel  . UA/M w/rflx Culture, Routine  . Cytomegalovirus antibody, IgG  . CMV IgM  . CBC with Differential/Platelet  . Cmv antibody, IgG (EIA)  . Specimen status report

## 2016-02-04 NOTE — Patient Instructions (Signed)
No contact sports of any kind, nothing that could cause abdominal injury or damage or blunt force to the spleen Try vitamin C (orange juice if not diabetic or vitamin C tablets) and drink green tea to help your immune system during your illness Get plenty of rest and hydration Have labs done today We'll get a HIDA scan If you get worse, go to the ER

## 2016-02-05 LAB — UA/M W/RFLX CULTURE, ROUTINE
BILIRUBIN UA: NEGATIVE
GLUCOSE, UA: NEGATIVE
KETONES UA: NEGATIVE
LEUKOCYTES UA: NEGATIVE
NITRITE UA: NEGATIVE
PROTEIN UA: NEGATIVE
RBC UA: NEGATIVE
SPEC GRAV UA: 1.008 (ref 1.005–1.030)
UUROB: 0.2 mg/dL (ref 0.2–1.0)
pH, UA: 6.5 (ref 5.0–7.5)

## 2016-02-05 LAB — COMPREHENSIVE METABOLIC PANEL
ALBUMIN: 4.6 g/dL (ref 3.5–5.5)
ALK PHOS: 77 IU/L (ref 39–117)
ALT: 20 IU/L (ref 0–32)
AST: 24 IU/L (ref 0–40)
Albumin/Globulin Ratio: 1.4 (ref 1.2–2.2)
BILIRUBIN TOTAL: 0.4 mg/dL (ref 0.0–1.2)
BUN / CREAT RATIO: 19 (ref 9–23)
BUN: 13 mg/dL (ref 6–20)
CALCIUM: 9.9 mg/dL (ref 8.7–10.2)
CHLORIDE: 99 mmol/L (ref 96–106)
CO2: 25 mmol/L (ref 18–29)
CREATININE: 0.68 mg/dL (ref 0.57–1.00)
GFR calc Af Amer: 146 mL/min/{1.73_m2} (ref >59)
GFR calc non Af Amer: 126 mL/min/{1.73_m2} (ref >59)
Globulin, Total: 3.3 g/dL (ref 1.5–4.5)
Glucose: 112 mg/dL — ABNORMAL HIGH (ref 65–99)
Potassium: 4.1 mmol/L (ref 3.5–5.2)
Sodium: 144 mmol/L (ref 134–144)
Total Protein: 7.9 g/dL (ref 6.0–8.5)

## 2016-02-05 LAB — MICROSCOPIC EXAMINATION
Bacteria, UA: NONE SEEN
Casts: NONE SEEN /lpf
RBC MICROSCOPIC, UA: NONE SEEN /HPF (ref 0–?)

## 2016-02-05 LAB — CBC WITH DIFFERENTIAL/PLATELET
BASOS: 1 %
Basophils Absolute: 0.1 10*3/uL (ref 0.0–0.2)
EOS (ABSOLUTE): 0.1 10*3/uL (ref 0.0–0.4)
Eos: 1 %
HEMATOCRIT: 39.3 % (ref 34.0–46.6)
Hemoglobin: 13.3 g/dL (ref 11.1–15.9)
IMMATURE GRANULOCYTES: 0 %
Immature Grans (Abs): 0 10*3/uL (ref 0.0–0.1)
LYMPHS: 41 %
Lymphocytes Absolute: 2.3 10*3/uL (ref 0.7–3.1)
MCH: 29.1 pg (ref 26.6–33.0)
MCHC: 33.8 g/dL (ref 31.5–35.7)
MCV: 86 fL (ref 79–97)
MONOS ABS: 0.4 10*3/uL (ref 0.1–0.9)
Monocytes: 8 %
NEUTROS ABS: 2.6 10*3/uL (ref 1.4–7.0)
Neutrophils: 49 %
PLATELETS: 339 10*3/uL (ref 150–379)
RBC: 4.57 x10E6/uL (ref 3.77–5.28)
RDW: 13.2 % (ref 12.3–15.4)
WBC: 5.4 10*3/uL (ref 3.4–10.8)

## 2016-02-05 LAB — CMV IGM

## 2016-02-08 ENCOUNTER — Telehealth: Payer: Self-pay | Admitting: Family Medicine

## 2016-02-08 NOTE — Telephone Encounter (Signed)
Bonita QuinLinda from nuclear medicine at the hospital stated that patient has appointment with them on Tuesday. In the system it states that her last menstrual 12-31-15 therefore patient will need pregnancy test and they will need the results before her appointment. Is it possible for you to do the test today or at latest Monday [phone]

## 2016-02-08 NOTE — Telephone Encounter (Signed)
Pt notified has an appt Monday will give then.

## 2016-02-10 ENCOUNTER — Telehealth: Payer: Self-pay | Admitting: Family Medicine

## 2016-02-10 NOTE — Telephone Encounter (Signed)
Megan MuirJamie, I may be missing it, but I don't see her cytomegalovirus IgG; her IgM is resulted, but not the IgG

## 2016-02-11 ENCOUNTER — Ambulatory Visit (INDEPENDENT_AMBULATORY_CARE_PROVIDER_SITE_OTHER): Payer: PRIVATE HEALTH INSURANCE | Admitting: Family Medicine

## 2016-02-11 ENCOUNTER — Encounter: Payer: Self-pay | Admitting: Family Medicine

## 2016-02-11 VITALS — BP 112/62 | HR 83 | Temp 98.7°F | Resp 12 | Wt 148.0 lb

## 2016-02-11 DIAGNOSIS — N926 Irregular menstruation, unspecified: Secondary | ICD-10-CM | POA: Diagnosis not present

## 2016-02-11 DIAGNOSIS — R161 Splenomegaly, not elsewhere classified: Secondary | ICD-10-CM

## 2016-02-11 DIAGNOSIS — R1011 Right upper quadrant pain: Secondary | ICD-10-CM

## 2016-02-11 DIAGNOSIS — R300 Dysuria: Secondary | ICD-10-CM | POA: Diagnosis not present

## 2016-02-11 LAB — POCT URINALYSIS DIPSTICK
Bilirubin, UA: NEGATIVE
Blood, UA: POSITIVE
Glucose, UA: NEGATIVE
Ketones, UA: NEGATIVE
Nitrite, UA: NEGATIVE
PROTEIN UA: NEGATIVE
Spec Grav, UA: 1.025
UROBILINOGEN UA: 0.2
pH, UA: 6.5

## 2016-02-11 LAB — POCT URINE PREGNANCY: Preg Test, Ur: NEGATIVE

## 2016-02-11 MED ORDER — NITROFURANTOIN MONOHYD MACRO 100 MG PO CAPS: 100.0000 mg | ORAL_CAPSULE | Freq: Two times a day (BID) | ORAL | Status: AC
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
## 2016-02-11 NOTE — Telephone Encounter (Signed)
I called [facility] for some reason it was not preformed, but i added on

## 2016-02-11 NOTE — Patient Instructions (Signed)
If you develop worsening pain, please go to the ER Start the antibiotics Avoid greasy, fatty foods Stay hydrated Call me with an update in one week or if symptoms worsen

## 2016-02-12 ENCOUNTER — Other Ambulatory Visit: Payer: Self-pay | Admitting: Family Medicine

## 2016-02-12 ENCOUNTER — Encounter
Admission: RE | Admit: 2016-02-12 | Discharge: 2016-02-12 | Disposition: A | Discharge status: Home / Self Care | Payer: PRIVATE HEALTH INSURANCE | Source: Ambulatory Visit | Attending: Family Medicine | Admitting: Family Medicine

## 2016-02-12 DIAGNOSIS — R197 Diarrhea, unspecified: Secondary | ICD-10-CM | POA: Diagnosis not present

## 2016-02-12 DIAGNOSIS — R1011 Right upper quadrant pain: Secondary | ICD-10-CM | POA: Diagnosis not present

## 2016-02-12 DIAGNOSIS — R300 Dysuria: Secondary | ICD-10-CM | POA: Diagnosis not present

## 2016-02-12 MED ORDER — SINCALIDE 5 MCG IJ SOLR
0.0200 ug/kg | Freq: Once | INTRAMUSCULAR | Status: AC
  Administered 2016-02-12: 1.35 ug via INTRAVENOUS

## 2016-02-12 MED ORDER — TECHNETIUM TC 99M MEBROFENIN IV KIT
5.0000 | PACK | Freq: Once | INTRAVENOUS | Status: AC | PRN
  Administered 2016-02-12: 5.12 via INTRAVENOUS

## 2016-02-13 ENCOUNTER — Encounter: Payer: Self-pay | Admitting: Family Medicine

## 2016-02-13 LAB — CMV ANTIBODY, IGG (EIA)

## 2016-02-13 LAB — SPECIMEN STATUS REPORT

## 2016-02-14 LAB — PLEASE NOTE

## 2016-02-14 LAB — URINE CULTURE

## 2016-03-02 NOTE — Assessment & Plan Note (Signed)
Possibly due to viral illness, EBV; advised against contact sports; no thrombocytopenia

## 2016-03-02 NOTE — Assessment & Plan Note (Signed)
HIDA scan pending; to ER if worse; she has already seen GI

## 2016-04-07 ENCOUNTER — Ambulatory Visit (INDEPENDENT_AMBULATORY_CARE_PROVIDER_SITE_OTHER): Payer: PRIVATE HEALTH INSURANCE | Admitting: Family Medicine

## 2016-04-07 ENCOUNTER — Encounter: Payer: Self-pay | Admitting: Family Medicine

## 2016-04-07 VITALS — BP 114/72 | HR 88 | Temp 99.0°F | Resp 14 | Wt 146.0 lb

## 2016-04-07 DIAGNOSIS — Z7189 Other specified counseling: Secondary | ICD-10-CM | POA: Diagnosis not present

## 2016-04-07 DIAGNOSIS — Z7184 Encounter for health counseling related to travel: Secondary | ICD-10-CM | POA: Insufficient documentation

## 2016-04-07 NOTE — Patient Instructions (Addendum)
If you are interested, you could get a typhoid vaccine at the health department (shot or oral) Have a fabulous time in [place] -- be safe and be smart
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
## 2016-06-08 NOTE — Assessment & Plan Note (Signed)
We reviewed the CDC website for travelers; she may get vaccine at health dept; no risk of malaria in her area; see AVS

## 2016-06-27 DIAGNOSIS — Z3049 Encounter for surveillance of other contraceptives: Secondary | ICD-10-CM | POA: Diagnosis not present

## 2016-07-01 DIAGNOSIS — Z3046 Encounter for surveillance of implantable subdermal contraceptive: Secondary | ICD-10-CM | POA: Diagnosis not present

## 2016-07-02 ENCOUNTER — Other Ambulatory Visit: Payer: Self-pay | Admitting: Certified Nurse Midwife

## 2016-07-02 ENCOUNTER — Ambulatory Visit
Admission: RE | Admit: 2016-07-02 | Discharge: 2016-07-02 | Disposition: A | Discharge status: Home / Self Care | Payer: PRIVATE HEALTH INSURANCE | Source: Ambulatory Visit | Attending: Certified Nurse Midwife | Admitting: Certified Nurse Midwife

## 2016-07-02 DIAGNOSIS — IMO0002 Reserved for concepts with insufficient information to code with codable children: Secondary | ICD-10-CM

## 2016-07-02 DIAGNOSIS — Z969 Presence of functional implant, unspecified: Secondary | ICD-10-CM | POA: Diagnosis not present

## 2016-07-02 DIAGNOSIS — Z30017 Encounter for initial prescription of implantable subdermal contraceptive: Secondary | ICD-10-CM

## 2016-07-30 DIAGNOSIS — H52223 Regular astigmatism, bilateral: Secondary | ICD-10-CM | POA: Diagnosis not present

## 2016-07-30 DIAGNOSIS — H5203 Hypermetropia, bilateral: Secondary | ICD-10-CM | POA: Diagnosis not present

## 2016-09-13 ENCOUNTER — Ambulatory Visit (INDEPENDENT_AMBULATORY_CARE_PROVIDER_SITE_OTHER): Payer: PRIVATE HEALTH INSURANCE | Admitting: Family Medicine

## 2016-09-13 VITALS — BP 114/78 | HR 117 | Temp 101.5°F | Resp 17 | Ht 67.0 in | Wt 141.0 lb

## 2016-09-13 DIAGNOSIS — J029 Acute pharyngitis, unspecified: Secondary | ICD-10-CM

## 2016-09-13 DIAGNOSIS — R509 Fever, unspecified: Secondary | ICD-10-CM

## 2016-09-13 DIAGNOSIS — J111 Influenza due to unidentified influenza virus with other respiratory manifestations: Secondary | ICD-10-CM | POA: Diagnosis not present

## 2016-09-13 LAB — POCT CBC
Granulocyte percent: 71.4 %G (ref 37–80)
HEMATOCRIT: 38.4 % (ref 37.7–47.9)
Hemoglobin: 13.5 g/dL (ref 12.2–16.2)
LYMPH, POC: 0.7 (ref 0.6–3.4)
MCH, POC: 29 pg (ref 27–31.2)
MCHC: 35.3 g/dL (ref 31.8–35.4)
MCV: 82.3 fL (ref 80–97)
MID (CBC): 0.3 (ref 0–0.9)
MPV: 9.6 fL (ref 0–99.8)
POC GRANULOCYTE: 2.5 (ref 2–6.9)
POC LYMPH %: 21.4 % (ref 10–50)
POC MID %: 7.2 % (ref 0–12)
Platelet Count, POC: 133 10*3/uL — AB (ref 142–424)
RBC: 4.67 M/uL (ref 4.04–5.48)
RDW, POC: 13.8 %
WBC: 3.5 10*3/uL — AB (ref 4.6–10.2)

## 2016-09-13 LAB — POCT RAPID STREP A (OFFICE): RAPID STREP A SCREEN: NEGATIVE

## 2016-09-13 LAB — POCT INFLUENZA A/B
INFLUENZA A, POC: POSITIVE — AB
INFLUENZA B, POC: NEGATIVE

## 2016-09-13 MED ORDER — HYDROCOD POLST-CPM POLST ER 10-8 MG/5ML PO SUER
5.0000 mL | Freq: Two times a day (BID) | ORAL | 0 refills | Status: DC | PRN
Start: 2016-09-13 — End: 2016-10-29

## 2016-09-13 NOTE — Patient Instructions (Addendum)
Continue Ibuprofen and Tylenol for fever.  Maintain adequate hydration and food intake.  May also continue Mucinex as needed.  Take Tussionex for cough, 5 ml every 12 hours as needed.  If symptoms have not improved by Monday and fever remains elevated, return for care.  Godfrey PickKimberly S. Tiburcio PeaHarris, MSN, FNP-C [facility]  IF you received an x-ray today, you will receive an invoice from [facility]. Please contact [facility] at [phone] with questions or concerns regarding your invoice.   IF you received labwork today, you will receive an invoice from [facility]. Please contact [facility] at [phone] with questions or concerns regarding your invoice.   Our billing staff will not be able to assist you with questions regarding bills from these companies.  You will be contacted with the lab results as soon as they are available. The fastest way to get your results is to activate your My Chart account. Instructions are located on the last page of this paperwork. If you have not heard from us regarding the results in 2 weeks, please contact this office.      Influenza, Adult Influenza ("the flu") is an infection in the lungs, nose, and throat (respiratory tract). It is caused by a virus. The flu causes many common cold symptoms, as well as a high fever and body aches. It can make you feel very sick. The flu spreads easily from person to person (is contagious). Getting a flu shot (influenza vaccination) every year is the best way to prevent the flu. Follow these instructions at home:  Take over-the-counter and prescription medicines only as told by your doctor.  Use a cool mist humidifier to add moisture (humidity) to the air in your home. This can make it easier to breathe.  Rest as needed.  Drink enough fluid to keep your pee (urine) clear or pale yellow.  Cover your mouth and nose when you cough or  sneeze.  Wash your hands with soap and water often, especially after you cough or sneeze. If you cannot use soap and water, use hand sanitizer.  Stay home from work or school as told by your doctor. Unless you are visiting your doctor, try to avoid leaving home until your fever has been gone for 24 hours without the use of medicine.  Keep all follow-up visits as told by your doctor. This is important. How is this prevented?  Getting a yearly (annual) flu shot is the best way to avoid getting the flu. You may get the flu shot in late summer, fall, or winter. Ask your doctor when you should get your flu shot.  Wash your hands often or use hand sanitizer often.  Avoid contact with people who are sick during cold and flu season.  Eat healthy foods.  Drink plenty of fluids.  Get enough sleep.  Exercise regularly. Contact a doctor if:  You get new symptoms.  You have:  Chest pain.  Watery poop (diarrhea).  A fever.  Your cough gets worse.  You start to have more mucus.  You feel sick to your stomach (nauseous).  You throw up (vomit). Get help right away if:  You start to be short of breath or have trouble breathing.  Your skin or nails turn a bluish color.  You have very bad pain or stiffness in your neck.  You get a sudden headache.  You get sudden pain in your face or ear.  You cannot stop throwing  up. This information is not intended to replace advice given to you by your health care provider. Make sure you discuss any questions you have with your health care provider. Document Released: 07/15/2008 Document Revised: 03/13/2016 Document Reviewed: 07/31/2015 Elsevier Interactive Patient Education  2017 [employer]
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
## 2016-09-14 LAB — CULTURE, GROUP A STREP: ORGANISM ID, BACTERIA: NORMAL

## 2016-10-29 ENCOUNTER — Ambulatory Visit (INDEPENDENT_AMBULATORY_CARE_PROVIDER_SITE_OTHER): Payer: PRIVATE HEALTH INSURANCE | Admitting: Emergency Medicine

## 2016-10-29 ENCOUNTER — Encounter: Payer: Self-pay | Admitting: Emergency Medicine

## 2016-10-29 VITALS — BP 110/76 | HR 81 | Temp 98.6°F | Resp 16 | Ht 67.0 in | Wt 141.0 lb

## 2016-10-29 DIAGNOSIS — R059 Cough, unspecified: Secondary | ICD-10-CM

## 2016-10-29 DIAGNOSIS — R05 Cough: Secondary | ICD-10-CM | POA: Diagnosis not present

## 2016-10-29 DIAGNOSIS — J019 Acute sinusitis, unspecified: Secondary | ICD-10-CM | POA: Diagnosis not present

## 2016-10-29 DIAGNOSIS — R0981 Nasal congestion: Secondary | ICD-10-CM | POA: Diagnosis not present

## 2016-10-29 DIAGNOSIS — R51 Headache: Secondary | ICD-10-CM | POA: Diagnosis not present

## 2016-10-29 DIAGNOSIS — R519 Headache, unspecified: Secondary | ICD-10-CM

## 2016-10-29 MED ORDER — PREDNISONE 20 MG PO TABS: 40.0000 mg | ORAL_TABLET | Freq: Every day | ORAL | 0 refills | Status: AC

## 2016-10-29 MED ORDER — AMOXICILLIN-POT CLAVULANATE 875-125 MG PO TABS: 1.0000 | ORAL_TABLET | Freq: Two times a day (BID) | ORAL | 0 refills | Status: DC
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
## 2016-10-29 NOTE — Patient Instructions (Signed)

## 2016-12-15 ENCOUNTER — Ambulatory Visit (INDEPENDENT_AMBULATORY_CARE_PROVIDER_SITE_OTHER): Payer: PRIVATE HEALTH INSURANCE | Admitting: Emergency Medicine

## 2016-12-15 VITALS — BP 100/62 | HR 70 | Temp 98.5°F | Resp 16 | Ht 67.0 in | Wt 144.0 lb

## 2016-12-15 DIAGNOSIS — H00014 Hordeolum externum left upper eyelid: Secondary | ICD-10-CM | POA: Diagnosis not present

## 2016-12-15 DIAGNOSIS — H0014 Chalazion left upper eyelid: Secondary | ICD-10-CM | POA: Diagnosis not present

## 2016-12-15 MED ORDER — CEPHALEXIN 500 MG PO CAPS: 500.0000 mg | ORAL_CAPSULE | Freq: Three times a day (TID) | ORAL | 0 refills | Status: AC
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
## 2016-12-15 NOTE — Patient Instructions (Addendum)
   IF you received an x-ray today, you will receive an invoice from [facility]. Please contact [facility] at [phone] with questions or concerns regarding your invoice.   IF you received labwork today, you will receive an invoice from [facility]. Please contact [facility] at [phone] with questions or concerns regarding your invoice.   Our billing staff will not be able to assist you with questions regarding bills from these companies.  You will be contacted with the lab results as soon as they are available. The fastest way to get your results is to activate your My Chart account. Instructions are located on the last page of this paperwork. If you have not heard from us regarding the results in 2 weeks, please contact this office.    Stye A stye is a bump on your eyelid caused by a bacterial infection. A stye can form inside the eyelid (internal stye) or outside the eyelid (external stye). An internal stye may be caused by an infected oil-producing gland inside your eyelid. An external stye may be caused by an infection at the base of your eyelash (hair follicle). Styes are very common. Anyone can get them at any age. They usually occur in just one eye, but you may have more than one in either eye. What are the causes? The infection is almost always caused by bacteria called Staphylococcus aureus. This is a common type of bacteria that lives on your skin. What increases the risk? You may be at higher risk for a stye if you have had one before. You may also be at higher risk if you have:  Diabetes.  Long-term illness.  Long-term eye redness.  A skin condition called seborrhea.  High fat levels in your blood (lipids). What are the signs or symptoms? Eyelid pain is the most common symptom of a stye. Internal styes are more painful than external styes. Other signs and symptoms may include:  Painful swelling of your eyelid.  A scratchy feeling in your  eye.  Tearing and redness of your eye.  Pus draining from the stye. How is this diagnosed? Your health care provider may be able to diagnose a stye just by examining your eye. The health care provider may also check to make sure:  You do not have a fever or other signs of a more serious infection.  The infection has not spread to other parts of your eye or areas around your eye. How is this treated? Most styes will clear up in a few days without treatment. In some cases, you may need to use antibiotic drops or ointment to prevent infection. Your health care provider may have to drain the stye surgically if your stye is:  Large.  Causing a lot of pain.  Interfering with your vision. This can be done using a thin blade or a needle. Follow these instructions at home:  Take medicines only as directed by your health care provider.  Apply a clean, warm compress to your eye for 10 minutes, 4 times a day.  Do not wear contact lenses or eye makeup until your stye has healed.  Do not try to pop or drain the stye. Contact a health care provider if:  You have chills or a fever.  Your stye does not go away after several days.  Your stye affects your vision.  Your eyeball becomes swollen, red, or painful. This information is not intended to replace advice given to you by your health care provider. Make sure   you discuss any questions you have with your health care provider. Document Released: 07/16/2005 Document Revised: 06/01/2016 Document Reviewed: 01/20/2014 Elsevier Interactive Patient Education  2017 Elsevier Inc.   

## 2016-12-16 ENCOUNTER — Telehealth: Payer: Self-pay
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
## 2016-12-17 NOTE — Telephone Encounter (Signed)
It is possible to get MRSA from animals- and vice versa. The important thing here is, is she better now that she started the antibiotics?

## 2016-12-18 NOTE — Telephone Encounter (Signed)
lmtcb with update on how she is doing

## 2016-12-18 NOTE — Telephone Encounter (Signed)
Called back, stye is shrinking, will monitor and if new fever or not resolved after antibiotic will call us.

## 2017-01-07 DIAGNOSIS — H0014 Chalazion left upper eyelid: Secondary | ICD-10-CM | POA: Diagnosis not present

## 2017-01-14 DIAGNOSIS — H0014 Chalazion left upper eyelid: Secondary | ICD-10-CM | POA: Diagnosis not present

## 2017-04-20 ENCOUNTER — Encounter: Payer: Self-pay | Admitting: Family Medicine

## 2017-04-20 ENCOUNTER — Ambulatory Visit (INDEPENDENT_AMBULATORY_CARE_PROVIDER_SITE_OTHER): Payer: PRIVATE HEALTH INSURANCE | Admitting: Family Medicine

## 2017-04-20 VITALS — BP 102/70 | HR 90 | Temp 98.4°F | Resp 16 | Ht 67.0 in | Wt 146.9 lb

## 2017-04-20 DIAGNOSIS — R635 Abnormal weight gain: Secondary | ICD-10-CM | POA: Diagnosis not present

## 2017-04-20 DIAGNOSIS — R519 Headache, unspecified: Secondary | ICD-10-CM

## 2017-04-20 DIAGNOSIS — R51 Headache: Secondary | ICD-10-CM

## 2017-04-20 DIAGNOSIS — R509 Fever, unspecified: Secondary | ICD-10-CM

## 2017-04-20 DIAGNOSIS — R5383 Other fatigue: Secondary | ICD-10-CM

## 2017-04-20 LAB — CBC WITH DIFFERENTIAL/PLATELET
BASOS ABS: 43 {cells}/uL (ref 0–200)
Basophils Relative: 1 %
EOS ABS: 86 {cells}/uL (ref 15–500)
Eosinophils Relative: 2 %
HEMATOCRIT: 40.9 % (ref 35.0–45.0)
Hemoglobin: 12.8 g/dL (ref 11.7–15.5)
Lymphocytes Relative: 35 %
Lymphs Abs: 1505 cells/uL (ref 850–3900)
MCH: 27 pg (ref 27.0–33.0)
MCHC: 31.3 g/dL — ABNORMAL LOW (ref 32.0–36.0)
MCV: 86.3 fL (ref 80.0–100.0)
MONO ABS: 301 {cells}/uL (ref 200–950)
MONOS PCT: 7 %
MPV: 12.6 fL — ABNORMAL HIGH (ref 7.5–12.5)
NEUTROS ABS: 2365 {cells}/uL (ref 1500–7800)
Neutrophils Relative %: 55 %
PLATELETS: 193 10*3/uL (ref 140–400)
RBC: 4.74 MIL/uL (ref 3.80–5.10)
RDW: 13.8 % (ref 11.0–15.0)
WBC: 4.3 10*3/uL (ref 3.8–10.8)

## 2017-04-20 LAB — COMPLETE METABOLIC PANEL WITH GFR
ALBUMIN: 4.3 g/dL (ref 3.6–5.1)
ALK PHOS: 56 U/L (ref 33–115)
ALT: 15 U/L (ref 6–29)
AST: 17 U/L (ref 10–30)
BILIRUBIN TOTAL: 0.5 mg/dL (ref 0.2–1.2)
BUN: 16 mg/dL (ref 7–25)
CALCIUM: 8.9 mg/dL (ref 8.6–10.2)
CO2: 23 mmol/L (ref 20–31)
CREATININE: 0.57 mg/dL (ref 0.50–1.10)
Chloride: 106 mmol/L (ref 98–110)
GFR, Est African American: >89 mL/min (ref >=60)
Glucose, Bld: 83 mg/dL (ref 65–99)
Potassium: 4.3 mmol/L (ref 3.5–5.3)
Sodium: 139 mmol/L (ref 135–146)
TOTAL PROTEIN: 7 g/dL (ref 6.1–8.1)

## 2017-04-20 LAB — TSH: TSH: 1.22 m[IU]/L

## 2017-04-20 NOTE — Progress Notes (Addendum)
Name: Megan [name]   MRN: [id number]    DOB: 10/17/1996   Date:04/20/2017       Progress Note  Subjective  Chief Complaint  Chief Complaint  Patient presents with  . Headache    feels like sinus pressure, sensitive to light, fatigue, temp 99-100    HPI  Pt presents with 3 week history of  temp 99-100F, headaches with light sensitivity and dull - can work through the headache - pain is usually around sinuses and eyes. Some nausea and diarrhea intermittently, 2 episodes of dizziness lasting <53min.  Muscles have been sore and has had fatigue intermittently. Endorses sinus congestion, some chills, occasional night sweats - not drenching the bed.  No sore throat or cough, no vomiting, dysuria, chest pain, shortness of breath.   2 years ago she was having similar symptoms and was diagnosed with mono. No recent tick bites. Does work at a Scient[employer], product/process development.  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 Great-grandmother has thyroid issues, no one else in the family. Has family hx T2DM.   Patient Active Problem List   Diagnosis Date Noted  . Travel advice encounter 04/07/2016  . Splenomegaly 02/04/2016  . RUQ pain 02/04/2016  . Fatigue 11/26/2015  . Allergic rhinitis 07/19/2015  . Generalized anxiety disorder 07/19/2015  . Gastritis and duodenitis 04/25/2015  . Anemia   . GERD (gastroesophageal reflux disease)     Social History  Substance Use Topics  . Smoking status: Never Smoker  . Smokeless tobacco: Never Used  . Alcohol use No     Current Outpatient Prescriptions:  .  ACZONE 7.5 % GEL, , Disp: , Rfl: 2 .  etonogestrel (NEXPLANON) 68 MG IMPL implant, 1 each by Subdermal route once., Disp: , Rfl:  .  tretinoin (RETIN-A) 0.05 % cream, , Disp: , Rfl: 2  Allergies  Allergen Reactions  . Sulfa Antibiotics Hives    Tingling tongue    ROS  Ten systems reviewed and is negative except as mentioned in  HPI  Objective  Vitals:   04/20/17 0937  BP: 102/70  Pulse: 90  Resp: 16  Temp: 98.4 F (36.9 C)  TempSrc: Oral  SpO2: 98%  Weight: 146 lb 14.4 oz (66.6 kg)  Height: 5\' 7"  (1.702 m)   Body mass index is 23.01 kg/m.  Nursing Note and Vital Signs reviewed.  Physical Exam  Constitutional: Patient appears well-developed and well-nourished.  No distress.  HEENT: head atraumatic, normocephalic, pupils equal and reactive to light, EOM's intact, TM's without erythema or bulging, no maxillary or frontal sinus pain on palpation, neck supple without lymphadenopathy, oropharynx pink and moist without exudate Cardiovascular: Normal rate, regular rhythm, S1/S2 present.  No murmur or rub heard. No BLE edema. Pulmonary/Chest: Effort normal and breath sounds clear. No respiratory distress or retractions. Abdominal: Soft and non-tender, bowel sounds present x4 quadrants.  No CVA Tenderness Psychiatric: Patient has a normal mood and affect. behavior is normal. Judgment and thought content normal.  No results found for this or any previous visit (from the past 2160 hour(s)).   Assessment & Plan  1. Nonintractable episodic headache, unspecified headache type - Epstein-Barr virus VCA antibody panel - Cytomegalovirus antibody, IgG - CBC w/Diff/Platelet - COMPLETE METABOLIC PANEL WITH GFR - Take Advil or Tylenol PRN for headaches.  2. Fever and chills - Epstein-Barr virus VCA antibody panel - Cytomegalovirus antibody, IgG - CBC w/Diff/Platelet - COMPLETE METABOLIC PANEL  WITH GFR  3. Fatigue, unspecified type - Epstein-Barr virus VCA antibody panel - Cytomegalovirus antibody, IgG - CBC w/Diff/Platelet - COMPLETE METABOLIC PANEL WITH GFR - TSH - Vitamin B12 - VITAMIN D 25 Hydroxy (Vit-D Deficiency, Fractures)  4. Weight gain - TSH   Discussed possible causes of symptoms including recurrence of Mono, stress/anxiety/depression - discussed stress reduction. We will check labs and  follow up accordingly. She will schedule CPE with her PCP for as soon as possible.  -Red flags and when to present for emergency care or RTC including fever >101.50F, chest pain, shortness of breath, new/worsening/un-resolving symptoms, significant fatigue/malaisea, abdominal pain/vomiting, reviewed with patient at time of visit. Follow up and care instructions discussed and provided in AVS.  I have reviewed this encounter including the documentation in this note and/or discussed this patient with the Deboraha Sprangprovider,Emily Boyce, FNP, NP-C. I am certifying that I agree with the content of this note as supervising physician.  Alba CoryKrichna Sowles, MD [facility] 04/26/2017, 9:10 PM

## 2017-04-20 NOTE — Patient Instructions (Signed)
Take Advil or Tylenol PRN for headaches.

## 2017-04-21 LAB — EPSTEIN-BARR VIRUS VCA ANTIBODY PANEL
EBV NA IGG: 582 U/mL — AB
EBV VCA IGG: 650 U/mL — AB
EBV VCA IgM: <36 U/mL

## 2017-04-21 LAB — CYTOMEGALOVIRUS ANTIBODY, IGG: Cytomegalovirus Ab-IgG: >10 U/mL — ABNORMAL HIGH (ref <0.60)

## 2017-04-21 LAB — VITAMIN D 25 HYDROXY (VIT D DEFICIENCY, FRACTURES): VIT D 25 HYDROXY: 36 ng/mL (ref 30–100)

## 2017-04-21 LAB — VITAMIN B12: VITAMIN B 12: 375 pg/mL (ref 200–1100)

## 2017-04-21 NOTE — Progress Notes (Signed)
Hello Ms. 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 Your B12 is only slightly low, so I would like for you to purchase an over-the-counter B12 supplement (whichever dosing is cheapest) and begin taking this daily. Be forewarned, it may turn your urine a bright yelllow color, and this is ok.  Otherwise, your laboratory tests look excellent.  Please stay really well hydrated, take over-the-counter vitamin C supplement while you are feeling sick, take Tylenol or Advil for pain or fever, and eat as healthy as possible. Please avoid high contact or jarring activities like running or contact sports until you are feeling better.   Keep your follow up with Dr. Sherie DonLada in about a month. If you have any questions, please do not hesitate to reach out. Best, Maurice SmallEmily Raney Koeppen NP-C

## 2017-05-06 ENCOUNTER — Encounter: Payer: PRIVATE HEALTH INSURANCE | Admitting: Family Medicine

## 2017-05-07 ENCOUNTER — Encounter: Payer: PRIVATE HEALTH INSURANCE | Admitting: Family Medicine

## 2017-05-14 ENCOUNTER — Ambulatory Visit (INDEPENDENT_AMBULATORY_CARE_PROVIDER_SITE_OTHER): Payer: PRIVATE HEALTH INSURANCE | Admitting: Family Medicine

## 2017-05-14 ENCOUNTER — Encounter: Payer: Self-pay | Admitting: Family Medicine

## 2017-05-14 DIAGNOSIS — Z0000 Encounter for general adult medical examination without abnormal findings: Secondary | ICD-10-CM | POA: Diagnosis not present
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
## 2017-05-14 NOTE — Patient Instructions (Addendum)
I do recommend vitamin B12 250 or 500 mcg daily  Health Maintenance, Female Adopting a healthy lifestyle and getting preventive care can go a long way to promote health and wellness. Talk with your health care provider about what schedule of regular examinations is right for you. This is a good chance for you to check in with your provider about disease prevention and staying healthy. In between checkups, there are plenty of things you can do on your own. Experts have done a lot of research about which lifestyle changes and preventive measures are most likely to keep you healthy. Ask your health care provider for more information. Weight and diet Eat a healthy diet  Be sure to include plenty of vegetables, fruits, low-fat dairy products, and lean protein.  Do not eat a lot of foods high in solid fats, added sugars, or salt.  Get regular exercise. This is one of the most important things you can do for your health. ? Most adults should exercise for at least 150 minutes each week. The exercise should increase your heart rate and make you sweat (moderate-intensity exercise). ? Most adults should also do strengthening exercises at least twice a week. This is in addition to the moderate-intensity exercise.  Maintain a healthy weight  Body mass index (BMI) is a measurement that can be used to identify possible weight problems. It estimates body fat based on height and weight. Your health care provider can help determine your BMI and help you achieve or maintain a healthy weight.  For females 77 years of age and older: ? A BMI below 18.5 is considered underweight. ? A BMI of 18.5 to 24.9 is normal. ? A BMI of 25 to 29.9 is considered overweight. ? A BMI of 30 and above is considered obese.  Watch levels of cholesterol and blood lipids  You should start having your blood tested for lipids and cholesterol at 22 years of age, then have this test every 5 years.  You may need to have your  cholesterol levels checked more often if: ? Your lipid or cholesterol levels are high. ? You are older than 22 years of age. ? You are at high risk for heart disease.  Cancer screening Lung Cancer  Lung cancer screening is recommended for adults 62-63 years old who are at high risk for lung cancer because of a history of smoking.  A yearly low-dose CT scan of the lungs is recommended for people who: ? Currently smoke. ? Have quit within the past 15 years. ? Have at least a 30-pack-year history of smoking. A pack year is smoking an average of one pack of cigarettes a day for 1 year.  Yearly screening should continue until it has been 15 years since you quit.  Yearly screening should stop if you develop a health problem that would prevent you from having lung cancer treatment.  Breast Cancer  Practice breast self-awareness. This means understanding how your breasts normally appear and feel.  It also means doing regular breast self-exams. Let your health care provider know about any changes, no matter how small.  If you are in your 20s or 30s, you should have a clinical breast exam (CBE) by a health care provider every 1-3 years as part of a regular health exam.  If you are 25 or older, have a CBE every year. Also consider having a breast X-ray (mammogram) every year.  If you have a family history of breast cancer, talk to your health care  provider about genetic screening.  If you are at high risk for breast cancer, talk to your health care provider about having an MRI and a mammogram every year.  Breast cancer gene (BRCA) assessment is recommended for women who have family members with BRCA-related cancers. BRCA-related cancers include: ? Breast. ? Ovarian. ? Tubal. ? Peritoneal cancers.  Results of the assessment will determine the need for genetic counseling and BRCA1 and BRCA2 testing.  Cervical Cancer Your health care provider may recommend that you be screened regularly  for cancer of the pelvic organs (ovaries, uterus, and vagina). This screening involves a pelvic examination, including checking for microscopic changes to the surface of your cervix (Pap test). You may be encouraged to have this screening done every 3 years, beginning at age 21.  For women ages 30-65, health care providers may recommend pelvic exams and Pap testing every 3 years, or they may recommend the Pap and pelvic exam, combined with testing for human papilloma virus (HPV), every 5 years. Some types of HPV increase your risk of cervical cancer. Testing for HPV may also be done on women of any age with unclear Pap test results.  Other health care providers may not recommend any screening for nonpregnant women who are considered low risk for pelvic cancer and who do not have symptoms. Ask your health care provider if a screening pelvic exam is right for you.  If you have had past treatment for cervical cancer or a condition that could lead to cancer, you need Pap tests and screening for cancer for at least 20 years after your treatment. If Pap tests have been discontinued, your risk factors (such as having a new sexual partner) need to be reassessed to determine if screening should resume. Some women have medical problems that increase the chance of getting cervical cancer. In these cases, your health care provider may recommend more frequent screening and Pap tests.  Colorectal Cancer  This type of cancer can be detected and often prevented.  Routine colorectal cancer screening usually begins at 22 years of age and continues through 22 years of age.  Your health care provider may recommend screening at an earlier age if you have risk factors for colon cancer.  Your health care provider may also recommend using home test kits to check for hidden blood in the stool.  A small camera at the end of a tube can be used to examine your colon directly (sigmoidoscopy or colonoscopy). This is done to  check for the earliest forms of colorectal cancer.  Routine screening usually begins at age 50.  Direct examination of the colon should be repeated every 5-10 years through 22 years of age. However, you may need to be screened more often if early forms of precancerous polyps or small growths are found.  Skin Cancer  Check your skin from head to toe regularly.  Tell your health care provider about any new moles or changes in moles, especially if there is a change in a mole's shape or color.  Also tell your health care provider if you have a mole that is larger than the size of a pencil eraser.  Always use sunscreen. Apply sunscreen liberally and repeatedly throughout the day.  Protect yourself by wearing long sleeves, pants, a wide-brimmed hat, and sunglasses whenever you are outside.  Heart disease, diabetes, and high blood pressure  High blood pressure causes heart disease and increases the risk of stroke. High blood pressure is more likely to develop in: ?   People who have blood pressure in the high end of the normal range (130-139/85-89 mm Hg). ? People who are overweight or obese. ? People who are African American.  If you are 18-39 years of age, have your blood pressure checked every 3-5 years. If you are 40 years of age or older, have your blood pressure checked every year. You should have your blood pressure measured twice-once when you are at a hospital or clinic, and once when you are not at a hospital or clinic. Record the average of the two measurements. To check your blood pressure when you are not at a hospital or clinic, you can use: ? An automated blood pressure machine at a pharmacy. ? A home blood pressure monitor.  If you are between 55 years and 79 years old, ask your health care provider if you should take aspirin to prevent strokes.  Have regular diabetes screenings. This involves taking a blood sample to check your fasting blood sugar level. ? If you are at a  normal weight and have a low risk for diabetes, have this test once every three years after 22 years of age. ? If you are overweight and have a high risk for diabetes, consider being tested at a younger age or more often. Preventing infection Hepatitis B  If you have a higher risk for hepatitis B, you should be screened for this virus. You are considered at high risk for hepatitis B if: ? You were born in a country where hepatitis B is common. Ask your health care provider which countries are considered high risk. ? Your parents were born in a high-risk country, and you have not been immunized against hepatitis B (hepatitis B vaccine). ? You have HIV or AIDS. ? You use needles to inject street drugs. ? You live with someone who has hepatitis B. ? You have had sex with someone who has hepatitis B. ? You get hemodialysis treatment. ? You take certain medicines for conditions, including cancer, organ transplantation, and autoimmune conditions.  Hepatitis C  Blood testing is recommended for: ? Everyone born from 1945 through 1965. ? Anyone with known risk factors for hepatitis C.  Sexually transmitted infections (STIs)  You should be screened for sexually transmitted infections (STIs) including gonorrhea and chlamydia if: ? You are sexually active and are younger than 22 years of age. ? You are older than 22 years of age and your health care provider tells you that you are at risk for this type of infection. ? Your sexual activity has changed since you were last screened and you are at an increased risk for chlamydia or gonorrhea. Ask your health care provider if you are at risk.  If you do not have HIV, but are at risk, it may be recommended that you take a prescription medicine daily to prevent HIV infection. This is called pre-exposure prophylaxis (PrEP). You are considered at risk if: ? You are sexually active and do not regularly use condoms or know the HIV status of your  partner(s). ? You take drugs by injection. ? You are sexually active with a partner who has HIV.  Talk with your health care provider about whether you are at high risk of being infected with HIV. If you choose to begin PrEP, you should first be tested for HIV. You should then be tested every 3 months for as long as you are taking PrEP. Pregnancy  If you are premenopausal and you may become pregnant, ask your health   care provider about preconception counseling.  If you may become pregnant, take 400 to 800 micrograms (mcg) of folic acid every day.  If you want to prevent pregnancy, talk to your health care provider about birth control (contraception). Osteoporosis and menopause  Osteoporosis is a disease in which the bones lose minerals and strength with aging. This can result in serious bone fractures. Your risk for osteoporosis can be identified using a bone density scan.  If you are 65 years of age or older, or if you are at risk for osteoporosis and fractures, ask your health care provider if you should be screened.  Ask your health care provider whether you should take a calcium or vitamin D supplement to lower your risk for osteoporosis.  Menopause may have certain physical symptoms and risks.  Hormone replacement therapy may reduce some of these symptoms and risks. Talk to your health care provider about whether hormone replacement therapy is right for you. Follow these instructions at home:  Schedule regular health, dental, and eye exams.  Stay current with your immunizations.  Do not use any tobacco products including cigarettes, chewing tobacco, or electronic cigarettes.  If you are pregnant, do not drink alcohol.  If you are breastfeeding, limit how much and how often you drink alcohol.  Limit alcohol intake to no more than 1 drink per day for nonpregnant women. One drink equals 12 ounces of beer, 5 ounces of wine, or 1 ounces of hard liquor.  Do not use street  drugs.  Do not share needles.  Ask your health care provider for help if you need support or information about quitting drugs.  Tell your health care provider if you often feel depressed.  Tell your health care provider if you have ever been abused or do not feel safe at home. This information is not intended to replace advice given to you by your health care provider. Make sure you discuss any questions you have with your health care provider. Document Released: 04/21/2011 Document Revised: 03/13/2016 Document Reviewed: 07/10/2015 [employer]vier Interactive Patient Education  2018 [employer]  

## 2017-05-20 DIAGNOSIS — Z0000 Encounter for general adult medical examination without abnormal findings: Secondary | ICD-10-CM | POA: Insufficient documentation

## 2017-05-20 NOTE — Assessment & Plan Note (Signed)
USPSTF grade A and B recommendations reviewed with patient; age-appropriate recommendations, preventive care, screening tests, etc discussed and encouraged; healthy living encouraged; see AVS for patient education given to patient  

## 2017-05-29 ENCOUNTER — Encounter: Payer: PRIVATE HEALTH INSURANCE | Admitting: Family Medicine

## 2017-09-06 IMAGING — US US ABDOMEN COMPLETE
1 series · 14 of 25 positions shown · non-contrast
Comparison: None

CLINICAL DATA: Epigastric pain.  Right upper quadrant pain

EXAM:
ABDOMEN ULTRASOUND COMPLETE

[Series 1: us abdomen complete · 0.22mm/px · 14 of 116 slices shown]
[im 1/116]
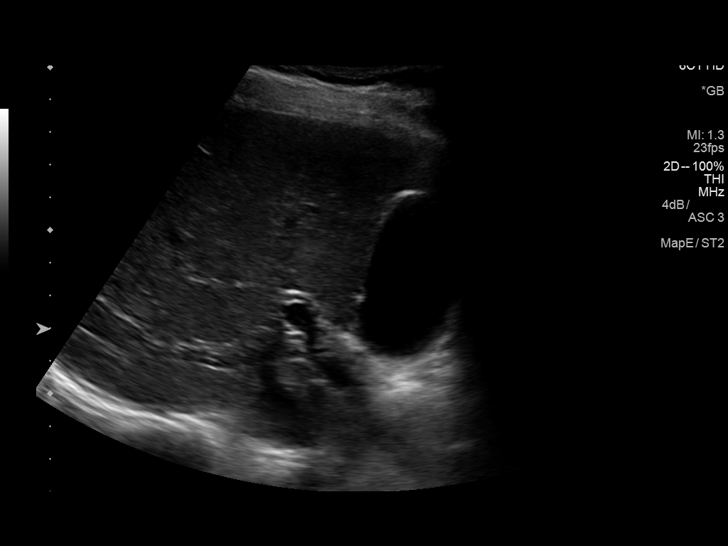
[im 10/116]
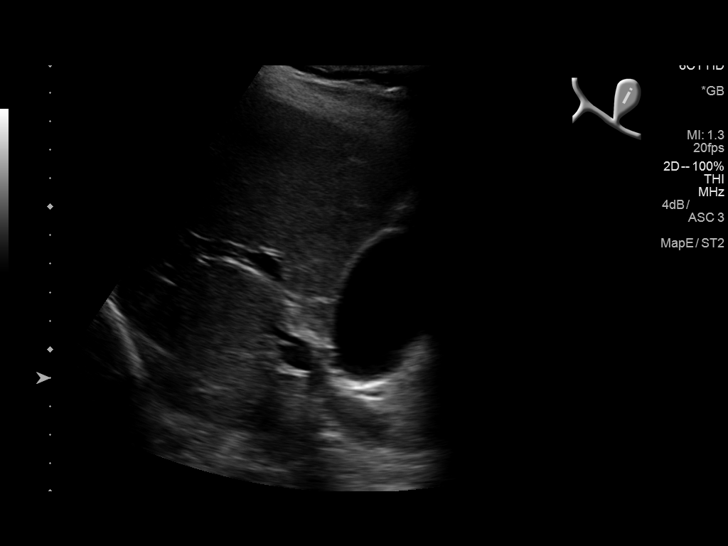
[im 20/116]
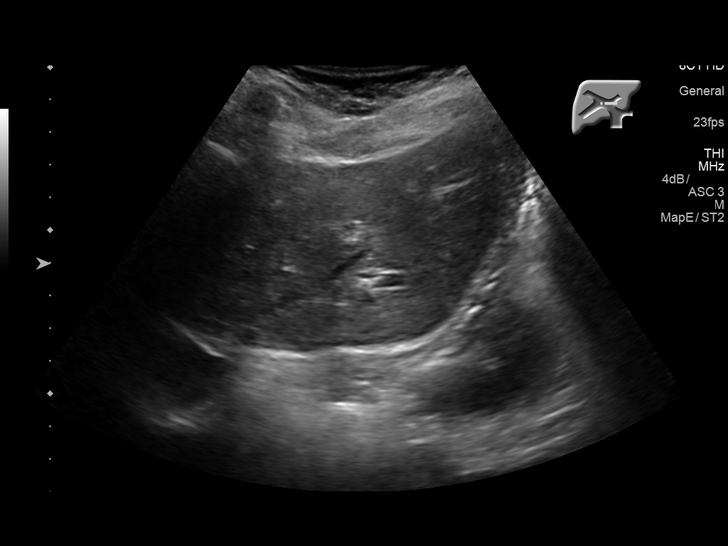
[im 29/116]
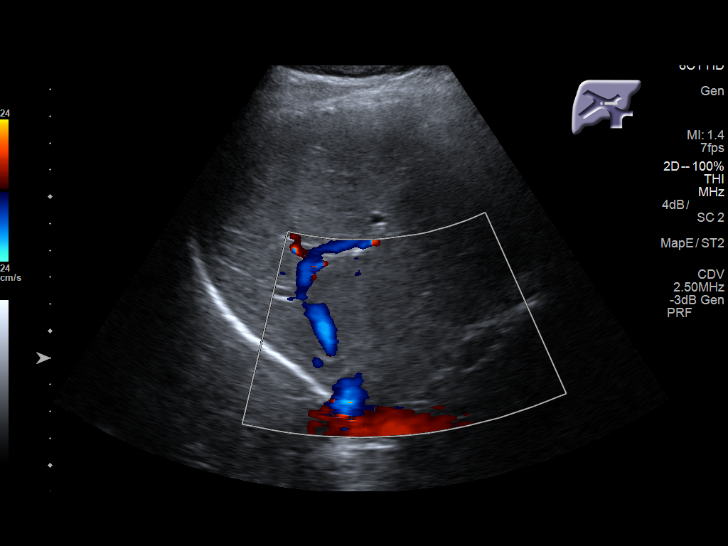
[im 39/116]
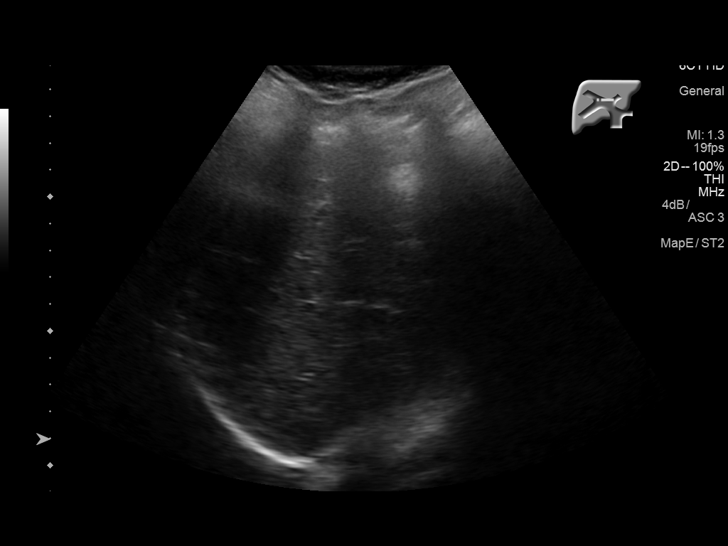
[im 44/116]
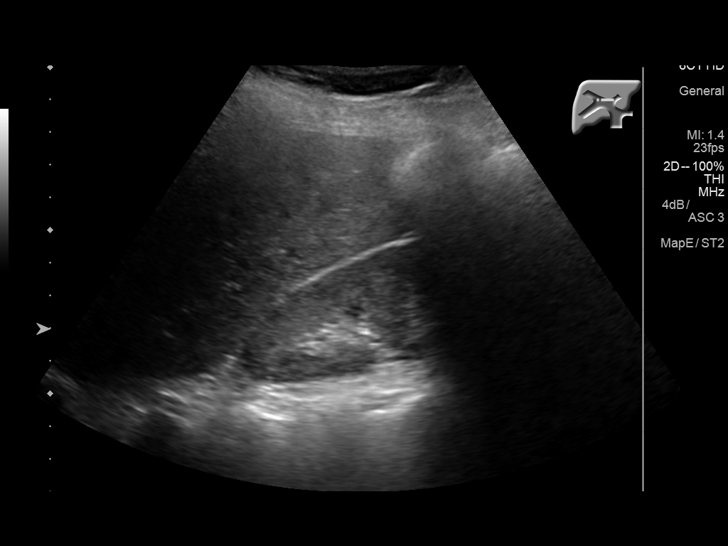
[im 53/116]
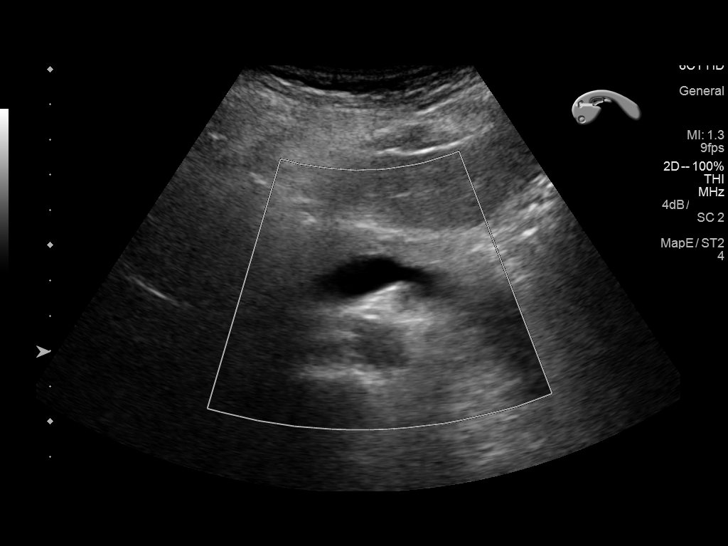
[im 63/116]
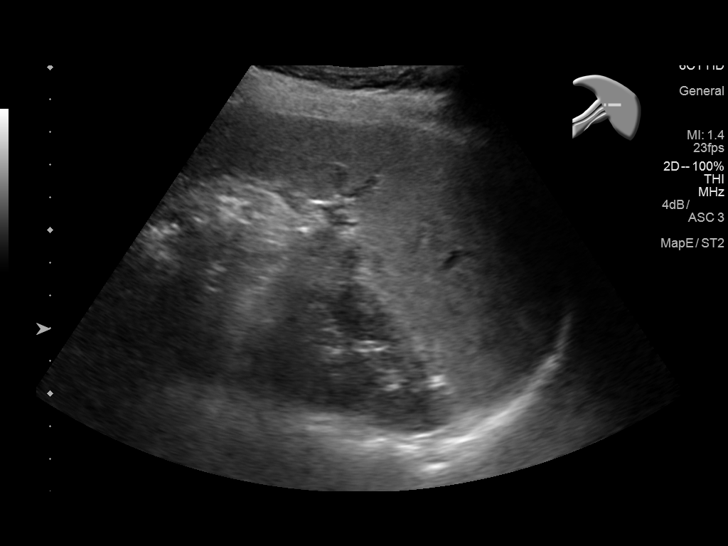
[im 72/116]
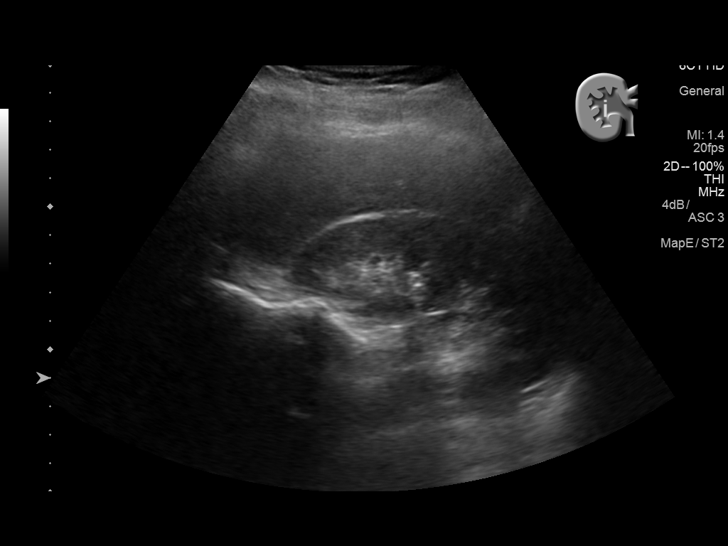
[im 77/116]
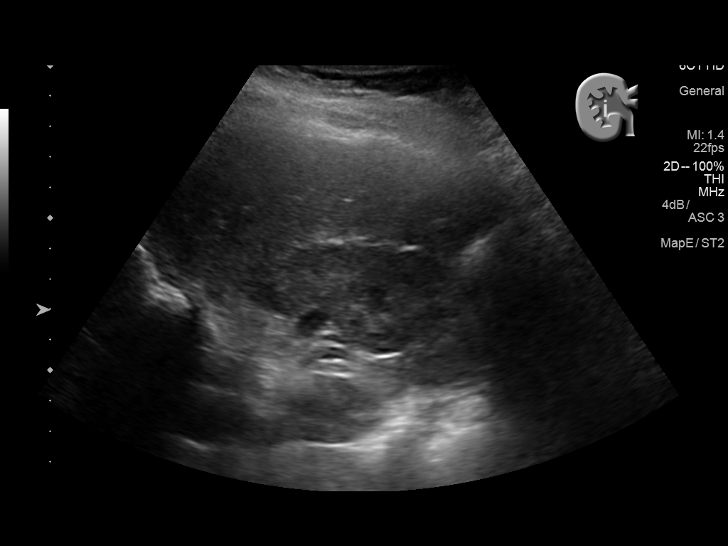
[im 87/116]
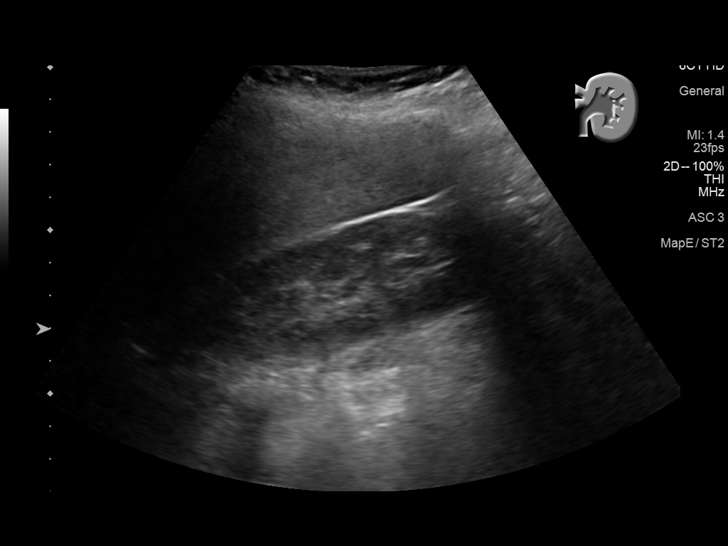
[im 96/116]
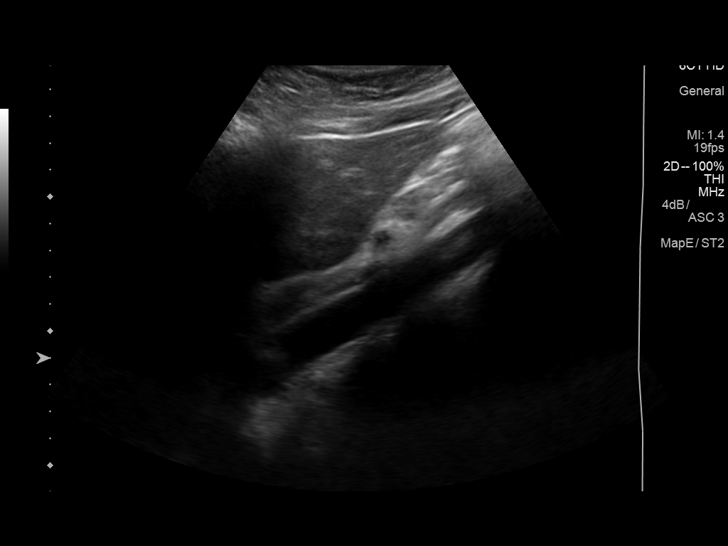
[im 106/116]
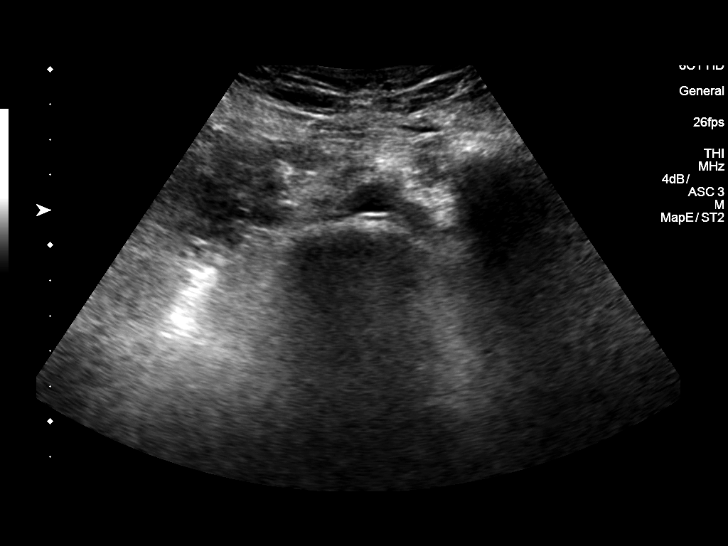
[im 116/116]
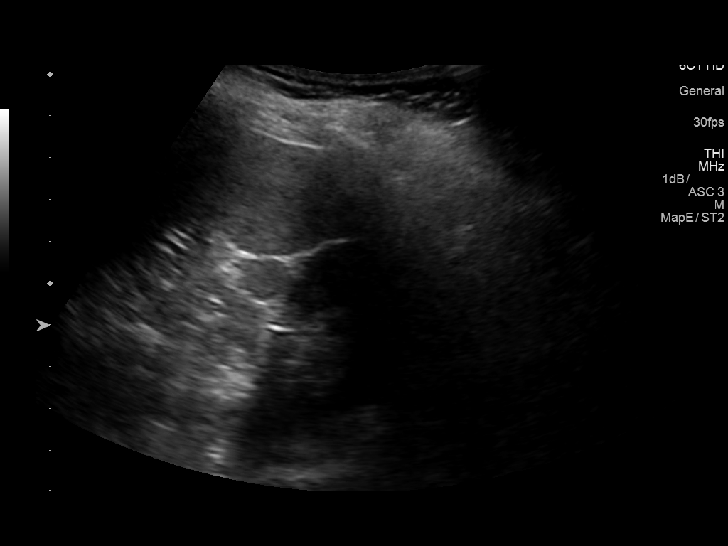

[14 of 25 positions shown; findings below may reference images not displayed]

FINDINGS: Gallbladder: No gallstones or wall thickening visualized. No
sonographic Murphy sign noted by sonographer.

Common bile duct: Diameter: 1.8 mm

Liver: No focal lesion identified. Within normal limits in
parenchymal echogenicity.

IVC: No abnormality visualized.

Pancreas: Visualized portion unremarkable.

Spleen: Measures 13.3 cm in length. The volume is equal to 426 cubic
cm.

Right Kidney: Length: 10.1 cm. Echogenicity within normal limits. No
mass or hydronephrosis visualized.

Left Kidney: Length: 11.3 cm. Echogenicity within normal limits. No
mass or hydronephrosis visualized.

Abdominal aorta: No aneurysm visualized.

Other findings: None.
IMPRESSION: 1. No acute findings.
2. Splenomegaly.

## 2017-10-28 IMAGING — NM NM HEPATO W/GB/PHARM/[PERSON_NAME]
4 series · 24 of 24 positions shown · non-contrast
Comparison: No prior nuclear imaging. Abdominal ultrasound
01/16/2016.

CLINICAL DATA: 19-year-old with 3 month history of right upper
quadrant abdominal pain and intermittent diarrhea. Previously normal
appearing gallbladder by sonography.

EXAM:
NUCLEAR MEDICINE HEPATOBILIARY IMAGING WITH GALLBLADDER EF
TECHNIQUE: Sequential images of the abdomen were obtained [DATE] minutes
following intravenous administration of radiopharmaceutical. After
slow intravenous infusion of 1.35 micrograms Cholecystokinin,
gallbladder ejection fraction was determined.
RADIOPHARMACEUTICALS:  5.1 mCi Ic-66m Choletec IV

[Series 1000: gallbladder ef dynamic · 4.80mm/px · 6 of 120 frames shown]
[frame 11/120]
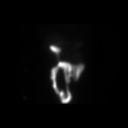
[frame 31/120]
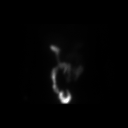
[frame 51/120]
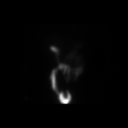
[frame 71/120]
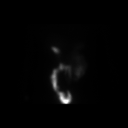
[frame 91/120]
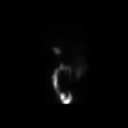
[frame 111/120]
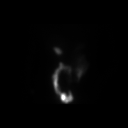

[Series 1000: hepatobiliary dynamic #2 lao · 9.59mm/px · 6 of 60 frames shown]
[frame 6/60]
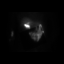
[frame 16/60]
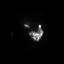
[frame 26/60]
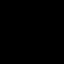
[frame 36/60]
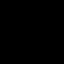
[frame 46/60]
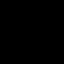
[frame 56/60]
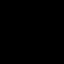

[Series 1000: hepatobiliary dynamic · 9.59mm/px · 6 of 60 frames shown]
[frame 6/60]
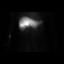
[frame 16/60]
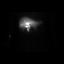
[frame 26/60]
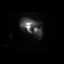
[frame 36/60]
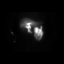
[frame 46/60]
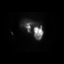
[frame 56/60]
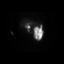

[Series 1000: gallbladder ef dynamic (results) · 4.80mm/px · 6 of 120 frames shown]
[frame 11/120]
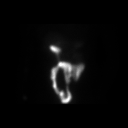
[frame 31/120]
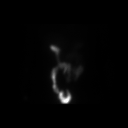
[frame 51/120]
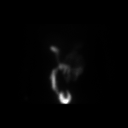
[frame 71/120]
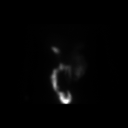
[frame 91/120]
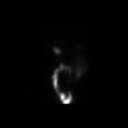
[frame 111/120]
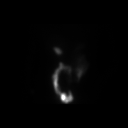

[24 of 24 positions shown; findings below may reference images not displayed]

FINDINGS: Hepatic uptake of Choletec is normal. Intra and extrahepatic bile
ducts are visible within 5 min. Small bowel activity is visible
within 10 min. Gallbladder activity is visible within 15 min, with
normal filling of the gallbladder throughout the remainder of the
imaging sequence. There is no significant tracer retention by the
liver.

Calculated gallbladder ejection fraction is 54%. (At 60 min, normal
ejection fraction is greater than 40%.)
IMPRESSION: Normal hepatobiliary imaging with normal gallbladder ejection
fraction.

## 2017-12-06 NOTE — Progress Notes (Signed)
Closing out scanned document note open since  April 2017

## 2022-05-30 ENCOUNTER — Ambulatory Visit: Payer: PRIVATE HEALTH INSURANCE | Admitting: Nurse Practitioner

## 2022-10-17 ENCOUNTER — Other Ambulatory Visit: Payer: Self-pay | Admitting: Internal Medicine

## 2022-10-17 DIAGNOSIS — L03121 Acute lymphangitis of right axilla: Secondary | ICD-10-CM

## 2022-11-14 ENCOUNTER — Ambulatory Visit
Admission: RE | Admit: 2022-11-14 | Discharge: 2022-11-14 | Disposition: A | Discharge status: Home / Self Care | Payer: 59 | Source: Ambulatory Visit | Attending: Internal Medicine | Admitting: Internal Medicine

## 2022-11-14 ENCOUNTER — Other Ambulatory Visit: Payer: Self-pay | Admitting: Internal Medicine

## 2022-11-14 DIAGNOSIS — L03121 Acute lymphangitis of right axilla: Secondary | ICD-10-CM

## 2023-03-23 LAB — OB RESULTS CONSOLE RPR: RPR: NONREACTIVE

## 2023-03-23 LAB — OB RESULTS CONSOLE HEPATITIS B SURFACE ANTIGEN: Hepatitis B Surface Ag: NEGATIVE

## 2023-03-23 LAB — OB RESULTS CONSOLE RUBELLA ANTIBODY, IGM: Rubella: IMMUNE

## 2023-03-23 LAB — OB RESULTS CONSOLE HIV ANTIBODY (ROUTINE TESTING): HIV: NONREACTIVE

## 2023-04-21 LAB — OB RESULTS CONSOLE GC/CHLAMYDIA
Chlamydia: NEGATIVE
Neisseria Gonorrhea: NEGATIVE

## 2023-07-03 ENCOUNTER — Encounter: Payer: Self-pay | Admitting: Gastroenterology

## 2023-07-07 ENCOUNTER — Telehealth: Payer: Self-pay

## 2023-07-07 NOTE — Telephone Encounter (Signed)
OBGYN nurse called in to see if we had an earlier appointment. The patient is pregnant with abdominal pain on one side and it is getting worst. I did inform her that we don't have urgent appointments at this time.

## 2023-07-08 ENCOUNTER — Other Ambulatory Visit (INDEPENDENT_AMBULATORY_CARE_PROVIDER_SITE_OTHER): Payer: 59

## 2023-07-08 ENCOUNTER — Ambulatory Visit (HOSPITAL_COMMUNITY)
Admission: RE | Admit: 2023-07-08 | Discharge: 2023-07-08 | Disposition: A | Discharge status: Home / Self Care | Payer: 59 | Source: Ambulatory Visit | Attending: Gastroenterology | Admitting: Gastroenterology

## 2023-07-08 ENCOUNTER — Ambulatory Visit (INDEPENDENT_AMBULATORY_CARE_PROVIDER_SITE_OTHER): Payer: 59 | Admitting: Gastroenterology

## 2023-07-08 ENCOUNTER — Encounter: Payer: Self-pay | Admitting: Gastroenterology

## 2023-07-08 VITALS — BP 90/60 | HR 80 | Ht 67.0 in | Wt 171.1 lb

## 2023-07-08 DIAGNOSIS — R1012 Left upper quadrant pain: Secondary | ICD-10-CM | POA: Diagnosis present

## 2023-07-08 DIAGNOSIS — R1013 Epigastric pain: Secondary | ICD-10-CM | POA: Diagnosis present

## 2023-07-08 DIAGNOSIS — R1011 Right upper quadrant pain: Secondary | ICD-10-CM

## 2023-07-08 DIAGNOSIS — R110 Nausea: Secondary | ICD-10-CM | POA: Diagnosis present

## 2023-07-08 LAB — HEPATIC FUNCTION PANEL
ALT: 34 U/L (ref 0–35)
AST: 29 U/L (ref 0–37)
Albumin: 3.7 g/dL (ref 3.5–5.2)
Alkaline Phosphatase: 75 U/L (ref 39–117)
Bilirubin, Direct: 0.1 mg/dL (ref 0.0–0.3)
Total Bilirubin: 0.5 mg/dL (ref 0.2–1.2)
Total Protein: 7.1 g/dL (ref 6.0–8.3)

## 2023-07-08 LAB — LIPASE: Lipase: 27 U/L (ref 11.0–59.0)

## 2023-07-08 NOTE — Patient Instructions (Signed)
Your provider has requested that you go to the basement level for lab work before leaving today. Press "B" on the elevator. The lab is located at the first door on the left as you exit the elevator.    You have been scheduled for an abdominal ultrasound at [facility] (1st floor of hospital) on 07/08/23 at 3 pm. Please arrive 30 minutes prior to your appointment for registration. Make certain not to have anything to eat or drink 6 hours prior to your appointment. Should you need to reschedule your appointment, please contact radiology at [phone]. This test typically takes about 30 minutes to perform.  _______________________________________________________  If your blood pressure at your visit was 140/90 or greater, please contact your primary care physician to follow up on this.  _______________________________________________________  If you are age 81 or older, your body mass index should be between 23-30. Your Body mass index is 26.8 kg/m. If this is out of the aforementioned range listed, please consider follow up with your Primary Care Provider.  If you are age 50 or younger, your body mass index should be between 19-25. Your Body mass index is 26.8 kg/m. If this is out of the aformentioned range listed, please consider follow up with your Primary Care Provider.   ________________________________________________________  The [facility] providers would like to encourage you to use [facility] to communicate with providers for non-urgent requests or questions.  Due to long hold times on the telephone, sending your provider a message by [facility] may be a faster and more efficient way to get a response.  Please allow 48 business hours for a response.  Please remember that this is for non-urgent requests.  _______________________________________________________
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
## 2023-07-09 ENCOUNTER — Telehealth: Payer: Self-pay | Admitting: Gastroenterology

## 2023-07-09 NOTE — Telephone Encounter (Signed)
Inbound call from patient returning phone call regarding recent imaging results. Patient requesting a call back. Please advise, thank you.

## 2023-07-09 NOTE — Telephone Encounter (Signed)
See results note dated 9/19

## 2023-07-13 ENCOUNTER — Inpatient Hospital Stay (HOSPITAL_COMMUNITY)
Admission: AD | Admit: 2023-07-13 | Discharge: 2023-07-14 | Disposition: A | Discharge status: Home / Self Care | Payer: 59 | Attending: Obstetrics and Gynecology | Admitting: Obstetrics and Gynecology

## 2023-07-13 ENCOUNTER — Ambulatory Visit: Payer: 59 | Admitting: Gastroenterology

## 2023-07-13 ENCOUNTER — Other Ambulatory Visit: Payer: Self-pay

## 2023-07-13 ENCOUNTER — Encounter (HOSPITAL_COMMUNITY): Payer: Self-pay | Admitting: Obstetrics and Gynecology

## 2023-07-13 DIAGNOSIS — Z79899 Other long term (current) drug therapy: Secondary | ICD-10-CM | POA: Insufficient documentation

## 2023-07-13 DIAGNOSIS — Z3A26 26 weeks gestation of pregnancy: Secondary | ICD-10-CM | POA: Insufficient documentation

## 2023-07-13 DIAGNOSIS — K219 Gastro-esophageal reflux disease without esophagitis: Secondary | ICD-10-CM | POA: Diagnosis not present

## 2023-07-13 DIAGNOSIS — R7401 Elevation of levels of liver transaminase levels: Secondary | ICD-10-CM | POA: Insufficient documentation

## 2023-07-13 DIAGNOSIS — O26892 Other specified pregnancy related conditions, second trimester: Secondary | ICD-10-CM | POA: Insufficient documentation

## 2023-07-13 DIAGNOSIS — O99612 Diseases of the digestive system complicating pregnancy, second trimester: Secondary | ICD-10-CM | POA: Insufficient documentation

## 2023-07-13 DIAGNOSIS — R1013 Epigastric pain: Secondary | ICD-10-CM | POA: Diagnosis present

## 2023-07-13 DIAGNOSIS — O99891 Other specified diseases and conditions complicating pregnancy: Secondary | ICD-10-CM | POA: Diagnosis not present

## 2023-07-13 DIAGNOSIS — K5900 Constipation, unspecified: Secondary | ICD-10-CM | POA: Insufficient documentation

## 2023-07-13 DIAGNOSIS — R1012 Left upper quadrant pain: Secondary | ICD-10-CM | POA: Diagnosis not present

## 2023-07-13 LAB — LIPASE, BLOOD: Lipase: 28 U/L (ref 11–51)

## 2023-07-13 LAB — COMPREHENSIVE METABOLIC PANEL
ALT: 49 U/L — ABNORMAL HIGH (ref 0–44)
AST: 37 U/L (ref 15–41)
Albumin: 2.7 g/dL — ABNORMAL LOW (ref 3.5–5.0)
Alkaline Phosphatase: 66 U/L (ref 38–126)
Anion gap: 9 (ref 5–15)
BUN: 14 mg/dL (ref 6–20)
CO2: 24 mmol/L (ref 22–32)
Calcium: 8.6 mg/dL — ABNORMAL LOW (ref 8.9–10.3)
Chloride: 104 mmol/L (ref 98–111)
Creatinine, Ser: 0.54 mg/dL (ref 0.44–1.00)
GFR, Estimated: >60 mL/min (ref >60)
Glucose, Bld: 91 mg/dL (ref 70–99)
Potassium: 3.8 mmol/L (ref 3.5–5.1)
Sodium: 137 mmol/L (ref 135–145)
Total Bilirubin: 0.3 mg/dL (ref 0.3–1.2)
Total Protein: 6.6 g/dL (ref 6.5–8.1)

## 2023-07-13 LAB — URINALYSIS, ROUTINE W REFLEX MICROSCOPIC
Bilirubin Urine: NEGATIVE
Glucose, UA: NEGATIVE mg/dL
Hgb urine dipstick: NEGATIVE
Ketones, ur: NEGATIVE mg/dL
Leukocytes,Ua: NEGATIVE
Nitrite: NEGATIVE
Protein, ur: NEGATIVE mg/dL
Specific Gravity, Urine: 1.01 (ref 1.005–1.030)
pH: 7 (ref 5.0–8.0)

## 2023-07-13 LAB — CBC
HCT: 34.9 % — ABNORMAL LOW (ref 36.0–46.0)
Hemoglobin: 11.7 g/dL — ABNORMAL LOW (ref 12.0–15.0)
MCH: 29.9 pg (ref 26.0–34.0)
MCHC: 33.5 g/dL (ref 30.0–36.0)
MCV: 89.3 fL (ref 80.0–100.0)
Platelets: 197 10*3/uL (ref 150–400)
RBC: 3.91 MIL/uL (ref 3.87–5.11)
RDW: 13.2 % (ref 11.5–15.5)
WBC: 7.8 10*3/uL (ref 4.0–10.5)
nRBC: 0 % (ref 0.0–0.2)

## 2023-07-13 LAB — AMYLASE: Amylase: 75 U/L (ref 28–100)

## 2023-07-13 MED ORDER — PANTOPRAZOLE SODIUM 40 MG IV SOLR
20.0000 mg | Freq: Once | INTRAVENOUS | Status: DC
  Filled 2023-07-13: qty 10

## 2023-07-13 MED ORDER — ALUM & MAG HYDROXIDE-SIMETH 200-200-20 MG/5ML PO SUSP: 15.0000 mL | ORAL | Status: DC

## 2023-07-13 MED ORDER — LACTATED RINGERS IV BOLUS
1000.0000 mL | Freq: Once | INTRAVENOUS | Status: AC
  Administered 2023-07-13: 1000 mL via INTRAVENOUS

## 2023-07-13 MED ORDER — ALUM & MAG HYDROXIDE-SIMETH 200-200-20 MG/5ML PO SUSP
30.0000 mL | ORAL | Status: AC
  Administered 2023-07-13: 30 mL via ORAL
  Filled 2023-07-13: qty 30

## 2023-07-13 MED ORDER — GLYCERIN (LAXATIVE) 2 G RE SUPP
1.0000 | Freq: Once | RECTAL | Status: AC
  Administered 2023-07-13: 1 via RECTAL
  Filled 2023-07-13: qty 1

## 2023-07-13 MED ORDER — PANTOPRAZOLE SODIUM 40 MG IV SOLR
40.0000 mg | Freq: Once | INTRAVENOUS | Status: AC
  Administered 2023-07-13: 40 mg via INTRAVENOUS

## 2023-07-13 NOTE — MAU Note (Signed)
.  Megan [name] is a 27 y.o. at [redacted]w[redacted]d here in MAU reporting: upper left sided abdominal pain on and off for 3 weeks. Seen GI doctor - had [place] and it was normal. Pain has continued to get worse - now like a stabbing pain that comes and goes over the past 3 days. Had diarrhea for a few weeks but has not had any in 10 days - only 2 hard BM since diarrhea subsided with the last one being earlier today. Taking miralax and colace every day. Took milk of mag and warm prune juice yesterday to see if it would help - unsure if she is constipated or not. Denies VB or LOF. +FM   Onset of complaint: 3 weeks Pain score: 7 Vitals:   07/13/23 2136  BP: (!) 111/59  Pulse: 81  Resp: 19  Temp: 98.4 F (36.9 C)  SpO2: 100%     FHT:149 Lab orders placed from triage:  UA

## 2023-07-14 ENCOUNTER — Telehealth: Payer: Self-pay | Admitting: Gastroenterology

## 2023-07-14 DIAGNOSIS — K5900 Constipation, unspecified: Secondary | ICD-10-CM

## 2023-07-14 DIAGNOSIS — Z3A26 26 weeks gestation of pregnancy: Secondary | ICD-10-CM | POA: Diagnosis not present

## 2023-07-14 MED ORDER — POLYETHYLENE GLYCOL 3350 17 GM/SCOOP PO POWD: 1.0000 | Freq: Once | ORAL | 0 refills | Status: AC

## 2023-07-14 MED ORDER — OMEPRAZOLE MAGNESIUM 20 MG PO TBEC: 20.0000 mg | DELAYED_RELEASE_TABLET | Freq: Every day | ORAL | 1 refills | Status: AC

## 2023-07-14 MED ORDER — SUCRALFATE 1 GM/10ML PO SUSP
1.0000 g | ORAL | Status: AC
  Administered 2023-07-14: 1 g via ORAL
  Filled 2023-07-14: qty 10

## 2023-07-14 MED ORDER — SENNA 8.6 MG PO TABS: 1.0000 | ORAL_TABLET | Freq: Every day | ORAL | 0 refills | Status: DC

## 2023-07-14 NOTE — Telephone Encounter (Signed)
The pt states that she took 8 doses of miralax about 3 hours ago but has not had any results as of now.  She has a call out to her OB as well but would like to know what else she should do. She also took senna as well and drank 64 ounces of fluid. Please advise

## 2023-07-14 NOTE — Telephone Encounter (Signed)
Inbound call from patient requesting a call regarding recent mychart message. Patient states miralax has not worked and would like to be advised further please advise, thank you.

## 2023-07-14 NOTE — Telephone Encounter (Signed)
FYI see note from pt.

## 2023-07-14 NOTE — Discharge Instructions (Signed)
You have constipation which is hard stools that are difficult to pass. It is important to have regular bowel movements every 1-3 days that are soft and easy to pass. Hard stools increase your risk of hemorrhoids and are very uncomfortable.   To prevent constipation you can increase the amount of fiber in your diet. Examples of foods with fiber are leafy greens, whole grain breads, oatmeal and other grains.  It is also important to drink at least eight 8oz glass of water everyday.   If you have not has a bowel movement in 4-5 days you made need to clean out your bowel.  This will have establish normal movement through your bowel.    Miralax Clean out  Take 8 capfuls of miralax in 64 oz of gatorade. You can use any fluid that appeals to you (gatorade, water, juice)  Continue to drink at least eight 8 oz glasses of water throughout the day  You can repeat with another 8 capfuls of miralax in 64 oz of gatorade if you are not having a large amount of stools  You will need to be at home and close to a bathroom for about 8 hours when you do the above as you may need to go to the bathroom frequently.   After you are cleaned out: - Start Colace100mg  twice daily - Start Miralax once daily - Start a daily fiber supplement like metamucil or citrucel - You can safely use enemas in pregnancy  - if you are having diarrhea you can reduce to Colace once a day or miralax every other day or a 1/2 capful daily.
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
## 2023-07-15 NOTE — Telephone Encounter (Signed)
See MyChart message for further communication.

## 2023-08-12 ENCOUNTER — Other Ambulatory Visit: Payer: Self-pay | Admitting: Family Medicine

## 2023-09-07 ENCOUNTER — Encounter (HOSPITAL_COMMUNITY): Payer: Self-pay | Admitting: *Deleted

## 2023-09-07 ENCOUNTER — Inpatient Hospital Stay (HOSPITAL_COMMUNITY)
Admission: AD | Admit: 2023-09-07 | Discharge: 2023-09-07 | Disposition: A | Discharge status: Home / Self Care | Payer: 59 | Attending: Obstetrics and Gynecology | Admitting: Obstetrics and Gynecology

## 2023-09-07 DIAGNOSIS — Z3A34 34 weeks gestation of pregnancy: Secondary | ICD-10-CM | POA: Diagnosis not present

## 2023-09-07 DIAGNOSIS — O4703 False labor before 37 completed weeks of gestation, third trimester: Secondary | ICD-10-CM

## 2023-09-07 LAB — URINALYSIS, ROUTINE W REFLEX MICROSCOPIC
Bilirubin Urine: NEGATIVE
Glucose, UA: NEGATIVE mg/dL
Hgb urine dipstick: NEGATIVE
Ketones, ur: NEGATIVE mg/dL
Leukocytes,Ua: NEGATIVE
Nitrite: NEGATIVE
Protein, ur: NEGATIVE mg/dL
Specific Gravity, Urine: 1.006 (ref 1.005–1.030)
pH: 7 (ref 5.0–8.0)

## 2023-09-07 NOTE — Discharge Instructions (Signed)
Return to MAU, if you have more than 6 PAINFUL contractions in ONE hour.  2/3-1-1 Rule Go to MAU for painful contractions every 2-3 minutes, lasting 1 minute each for 1.5 hours.
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
## 2023-09-08 ENCOUNTER — Other Ambulatory Visit: Payer: Self-pay | Admitting: Obstetrics and Gynecology

## 2023-09-16 LAB — OB RESULTS CONSOLE GBS: GBS: NEGATIVE

## 2023-09-25 ENCOUNTER — Encounter (HOSPITAL_COMMUNITY): Payer: Self-pay

## 2023-09-25 ENCOUNTER — Telehealth (HOSPITAL_COMMUNITY): Payer: Self-pay | Admitting: *Deleted

## 2023-09-25 NOTE — Patient Instructions (Signed)
Megan [name]  09/25/2023   Your procedure is scheduled on:  10/09/2023  Arrive at 1030 at Entrance C on [address] at [facility]  and [employer]. You are invited to use the FREE valet parking or use the Visitor's parking deck.  Pick up the phone at the desk and dial [phone].  Call this number if you have problems the morning of surgery: [phone]  Remember:   Do not eat food:(After Midnight) Desps de medianoche.  You may drink clear liquids until arrival at __1030___.  Clear liquids means a liquid you can see thru.  It can have color such as Cola or Kool aid.  Tea is OK and coffee as long as no milk or creamer of any kind.  Take these medicines the morning of surgery with A SIP OF WATER:  none   Do not wear jewelry, make-up or nail polish.  Do not wear lotions, powders, or perfumes. Do not wear deodorant.  Do not shave 48 hours prior to surgery.  Do not bring valuables to the hospital.  [facility] is not   responsible for any belongings or valuables brought to the hospital.  Contacts, dentures or bridgework may not be worn into surgery.  Leave suitcase in the car. After surgery it may be brought to your room.  For patients admitted to the hospital, checkout time is 11:00 AM the day of              discharge.      Please read over the following fact sheets that you were given:     Preparing for Surgery

## 2023-09-25 NOTE — Telephone Encounter (Signed)
Preadmission screen  

## 2023-09-30 ENCOUNTER — Encounter (HOSPITAL_COMMUNITY): Payer: Self-pay

## 2023-10-07 ENCOUNTER — Encounter (HOSPITAL_COMMUNITY)
Admission: RE | Admit: 2023-10-07 | Discharge: 2023-10-07 | Disposition: A | Discharge status: Home / Self Care | Payer: 59 | Source: Ambulatory Visit | Attending: Obstetrics and Gynecology

## 2023-10-07 DIAGNOSIS — Z01812 Encounter for preprocedural laboratory examination: Secondary | ICD-10-CM | POA: Insufficient documentation

## 2023-10-07 HISTORY — DX: Constipation, unspecified: K59.00

## 2023-10-07 LAB — CBC
HCT: 37.9 % (ref 36.0–46.0)
Hemoglobin: 12.5 g/dL (ref 12.0–15.0)
MCH: 28.5 pg (ref 26.0–34.0)
MCHC: 33 g/dL (ref 30.0–36.0)
MCV: 86.3 fL (ref 80.0–100.0)
Platelets: 197 10*3/uL (ref 150–400)
RBC: 4.39 MIL/uL (ref 3.87–5.11)
RDW: 14.1 % (ref 11.5–15.5)
WBC: 9.5 10*3/uL (ref 4.0–10.5)
nRBC: 0 % (ref 0.0–0.2)

## 2023-10-07 LAB — TYPE AND SCREEN
ABO/RH(D): O POS
Antibody Screen: NEGATIVE

## 2023-10-08 LAB — RPR: RPR Ser Ql: NONREACTIVE

## 2023-10-08 NOTE — H&P (Signed)
  sounds.  Pulmonary:     Breath sounds: Normal breath sounds.  Abdominal:     General: Bowel sounds are normal.     Palpations: Abdomen is soft.  Genitourinary:    General: Normal vulva.  Musculoskeletal:        General: Normal range of motion.     Cervical back: Normal range of motion and neck supple.  Skin:    General: Skin is warm and dry.  Neurological:     General: No focal deficit present.     Mental Status: She is alert and oriented to person, place, and time.  Psychiatric:        Behavior: Behavior normal.     Prenatal labs: ABO, Rh: --/--/O POS (12/18  1000) Antibody: NEG (12/18 1000) Rubella: Immune (06/03 0000) RPR: NON REACTIVE (12/18 1030)  HBsAg: Negative (06/03 0000)  HIV: Non-reactive (06/03 0000)  GBS: Negative/-- (11/27 0000)   Assessment/Plan: 39 wk IUP with SROM and early labor History of pelvic fracture with ortho concerns for VD Primary csection. Risks of anesthesia , infection, bleeding, injury to surrounding organs with need for repair discussed.Consent done.  Megan [name] J 10/08/2023, 9:17 PM
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
## 2023-10-09 ENCOUNTER — Inpatient Hospital Stay (HOSPITAL_COMMUNITY): Admission: AD | Admit: 2023-10-09 | Payer: 59 | Source: Home / Self Care | Admitting: Obstetrics and Gynecology

## 2023-10-09 ENCOUNTER — Encounter (HOSPITAL_COMMUNITY)
Admission: AD | Disposition: A | Discharge status: Home / Self Care | Payer: Self-pay | Source: Home / Self Care | Attending: Obstetrics and Gynecology

## 2023-10-09 ENCOUNTER — Inpatient Hospital Stay (HOSPITAL_COMMUNITY): Payer: 59 | Admitting: Certified Registered Nurse Anesthetist

## 2023-10-09 ENCOUNTER — Inpatient Hospital Stay (HOSPITAL_COMMUNITY)
Admission: AD | Admit: 2023-10-09 | Discharge: 2023-10-12 | DRG: 788 | Disposition: A | Discharge status: Home / Self Care | Payer: 59 | Attending: Obstetrics and Gynecology | Admitting: Obstetrics and Gynecology

## 2023-10-09 ENCOUNTER — Other Ambulatory Visit: Payer: Self-pay

## 2023-10-09 ENCOUNTER — Encounter (HOSPITAL_COMMUNITY): Payer: Self-pay | Admitting: Obstetrics and Gynecology

## 2023-10-09 DIAGNOSIS — M6289 Other specified disorders of muscle: Secondary | ICD-10-CM | POA: Diagnosis present

## 2023-10-09 DIAGNOSIS — O99892 Other specified diseases and conditions complicating childbirth: Principal | ICD-10-CM | POA: Diagnosis present

## 2023-10-09 DIAGNOSIS — Z833 Family history of diabetes mellitus: Secondary | ICD-10-CM

## 2023-10-09 DIAGNOSIS — O9962 Diseases of the digestive system complicating childbirth: Secondary | ICD-10-CM | POA: Diagnosis present

## 2023-10-09 DIAGNOSIS — O9902 Anemia complicating childbirth: Secondary | ICD-10-CM | POA: Diagnosis present

## 2023-10-09 DIAGNOSIS — Z3A39 39 weeks gestation of pregnancy: Secondary | ICD-10-CM | POA: Diagnosis not present

## 2023-10-09 DIAGNOSIS — F411 Generalized anxiety disorder: Secondary | ICD-10-CM | POA: Diagnosis present

## 2023-10-09 DIAGNOSIS — Z8781 Personal history of (healed) traumatic fracture: Secondary | ICD-10-CM

## 2023-10-09 DIAGNOSIS — K219 Gastro-esophageal reflux disease without esophagitis: Secondary | ICD-10-CM | POA: Diagnosis present

## 2023-10-09 DIAGNOSIS — Z98891 History of uterine scar from previous surgery: Secondary | ICD-10-CM

## 2023-10-09 LAB — ABO/RH: ABO/RH(D): O POS

## 2023-10-09 LAB — POCT FERN TEST: POCT Fern Test: POSITIVE

## 2023-10-09 SURGERY — Surgical Case
Anesthesia: Monitor Anesthesia Care

## 2023-10-09 MED ORDER — STERILE WATER FOR IRRIGATION IR SOLN
Status: DC | PRN
  Administered 2023-10-09: 1

## 2023-10-09 MED ORDER — PRENATAL MULTIVITAMIN CH
1.0000 | ORAL_TABLET | Freq: Every day | ORAL | Status: DC
  Administered 2023-10-09 – 2023-10-11 (×3): 1 via ORAL
  Filled 2023-10-09 (×3): qty 1

## 2023-10-09 MED ORDER — IBUPROFEN 600 MG PO TABS
600.0000 mg | ORAL_TABLET | Freq: Four times a day (QID) | ORAL | Status: DC
  Administered 2023-10-10 – 2023-10-12 (×9): 600 mg via ORAL
  Filled 2023-10-09 (×9): qty 1

## 2023-10-09 MED ORDER — FENTANYL CITRATE (PF) 100 MCG/2ML IJ SOLN: 25.0000 ug | INTRAMUSCULAR | Status: DC | PRN

## 2023-10-09 MED ORDER — OXYCODONE HCL 5 MG PO TABS
5.0000 mg | ORAL_TABLET | ORAL | Status: DC | PRN
  Administered 2023-10-12: 5 mg via ORAL
  Filled 2023-10-09: qty 1

## 2023-10-09 MED ORDER — DEXAMETHASONE SODIUM PHOSPHATE 10 MG/ML IJ SOLN
INTRAMUSCULAR | Status: AC
Start: 2023-10-09 — End: ?
  Filled 2023-10-09: qty 1

## 2023-10-09 MED ORDER — KETOROLAC TROMETHAMINE 30 MG/ML IJ SOLN
30.0000 mg | Freq: Four times a day (QID) | INTRAMUSCULAR | Status: AC
Start: 2023-10-09 — End: 2023-10-10
  Administered 2023-10-09 – 2023-10-10 (×4): 30 mg via INTRAVENOUS
  Filled 2023-10-09 (×3): qty 1

## 2023-10-09 MED ORDER — CEFAZOLIN SODIUM-DEXTROSE 2-4 GM/100ML-% IV SOLN
INTRAVENOUS | Status: AC
  Filled 2023-10-09: qty 100

## 2023-10-09 MED ORDER — ONDANSETRON HCL 4 MG/2ML IJ SOLN
INTRAMUSCULAR | Status: AC
  Filled 2023-10-09: qty 2

## 2023-10-09 MED ORDER — MORPHINE SULFATE (PF) 0.5 MG/ML IJ SOLN
INTRAMUSCULAR | Status: AC
  Filled 2023-10-09: qty 10

## 2023-10-09 MED ORDER — METHYLERGONOVINE MALEATE 0.2 MG/ML IJ SOLN: 0.2000 mg | INTRAMUSCULAR | Status: DC | PRN

## 2023-10-09 MED ORDER — METHYLERGONOVINE MALEATE 0.2 MG PO TABS: 0.2000 mg | ORAL_TABLET | ORAL | Status: DC | PRN

## 2023-10-09 MED ORDER — ACETAMINOPHEN 10 MG/ML IV SOLN: 1000.0000 mg | Freq: Once | INTRAVENOUS | Status: DC | PRN

## 2023-10-09 MED ORDER — DEXAMETHASONE SODIUM PHOSPHATE 10 MG/ML IJ SOLN
INTRAMUSCULAR | Status: DC | PRN
  Administered 2023-10-09: 10 mg via INTRAVENOUS

## 2023-10-09 MED ORDER — BUPIVACAINE HCL (PF) 0.25 % IJ SOLN
INTRAMUSCULAR | Status: DC | PRN
  Administered 2023-10-09: 20 mL

## 2023-10-09 MED ORDER — ACETAMINOPHEN 10 MG/ML IV SOLN
INTRAVENOUS | Status: DC | PRN
  Administered 2023-10-09: 1000 mg via INTRAVENOUS

## 2023-10-09 MED ORDER — LACTATED RINGERS IV SOLN: INTRAVENOUS | Status: DC

## 2023-10-09 MED ORDER — PHENYLEPHRINE HCL-NACL 20-0.9 MG/250ML-% IV SOLN
INTRAVENOUS | Status: DC | PRN
  Administered 2023-10-09: 60 ug/min via INTRAVENOUS

## 2023-10-09 MED ORDER — COCONUT OIL OIL
1.0000 | TOPICAL_OIL | Status: DC | PRN
  Administered 2023-10-10: 1 via TOPICAL

## 2023-10-09 MED ORDER — OXYTOCIN-SODIUM CHLORIDE 30-0.9 UT/500ML-% IV SOLN
INTRAVENOUS | Status: DC | PRN
  Administered 2023-10-09: 300 mL via INTRAVENOUS

## 2023-10-09 MED ORDER — KETOROLAC TROMETHAMINE 30 MG/ML IJ SOLN
INTRAMUSCULAR | Status: AC
  Filled 2023-10-09: qty 1

## 2023-10-09 MED ORDER — BUPIVACAINE LIPOSOME 1.3 % IJ SUSP
INTRAMUSCULAR | Status: AC
  Filled 2023-10-09: qty 20

## 2023-10-09 MED ORDER — MORPHINE SULFATE (PF) 0.5 MG/ML IJ SOLN
INTRAMUSCULAR | Status: DC | PRN
  Administered 2023-10-09: 150 ug via INTRATHECAL

## 2023-10-09 MED ORDER — CEFAZOLIN SODIUM-DEXTROSE 2-4 GM/100ML-% IV SOLN
2.0000 g | INTRAVENOUS | Status: AC
  Administered 2023-10-09: 2 g via INTRAVENOUS

## 2023-10-09 MED ORDER — BUPIVACAINE LIPOSOME 1.3 % IJ SUSP
INTRAMUSCULAR | Status: DC | PRN
  Administered 2023-10-09: 20 mL

## 2023-10-09 MED ORDER — MENTHOL 3 MG MT LOZG: 1.0000 | LOZENGE | OROMUCOSAL | Status: DC | PRN

## 2023-10-09 MED ORDER — ONDANSETRON HCL 4 MG/2ML IJ SOLN
INTRAMUSCULAR | Status: DC | PRN
  Administered 2023-10-09: 4 mg via INTRAVENOUS

## 2023-10-09 MED ORDER — DIPHENHYDRAMINE HCL 25 MG PO CAPS: 25.0000 mg | ORAL_CAPSULE | Freq: Four times a day (QID) | ORAL | Status: DC | PRN

## 2023-10-09 MED ORDER — WITCH HAZEL-GLYCERIN EX PADS: 1.0000 | MEDICATED_PAD | CUTANEOUS | Status: DC | PRN

## 2023-10-09 MED ORDER — BUPIVACAINE IN DEXTROSE 0.75-8.25 % IT SOLN
INTRATHECAL | Status: DC | PRN
  Administered 2023-10-09: 1.5 mL via INTRATHECAL

## 2023-10-09 MED ORDER — FENTANYL CITRATE (PF) 100 MCG/2ML IJ SOLN
INTRAMUSCULAR | Status: DC | PRN
  Administered 2023-10-09: 15 ug via INTRATHECAL

## 2023-10-09 MED ORDER — OXYTOCIN-SODIUM CHLORIDE 30-0.9 UT/500ML-% IV SOLN
INTRAVENOUS | Status: AC
Start: 2023-10-09 — End: ?
  Filled 2023-10-09: qty 500

## 2023-10-09 MED ORDER — POVIDONE-IODINE 10 % EX SWAB
2.0000 | Freq: Once | CUTANEOUS | Status: AC
  Administered 2023-10-09: 2 via TOPICAL

## 2023-10-09 MED ORDER — SIMETHICONE 80 MG PO CHEW: 80.0000 mg | CHEWABLE_TABLET | ORAL | Status: DC | PRN

## 2023-10-09 MED ORDER — OXYTOCIN-SODIUM CHLORIDE 30-0.9 UT/500ML-% IV SOLN: 2.5000 [IU]/h | INTRAVENOUS | Status: AC

## 2023-10-09 MED ORDER — SENNOSIDES-DOCUSATE SODIUM 8.6-50 MG PO TABS
2.0000 | ORAL_TABLET | Freq: Every day | ORAL | Status: DC
  Administered 2023-10-10 – 2023-10-12 (×3): 2 via ORAL
  Filled 2023-10-09 (×3): qty 2

## 2023-10-09 MED ORDER — SIMETHICONE 80 MG PO CHEW
80.0000 mg | CHEWABLE_TABLET | Freq: Three times a day (TID) | ORAL | Status: DC
  Administered 2023-10-09 – 2023-10-12 (×9): 80 mg via ORAL
  Filled 2023-10-09 (×11): qty 1

## 2023-10-09 MED ORDER — BUPIVACAINE HCL (PF) 0.25 % IJ SOLN
INTRAMUSCULAR | Status: AC
  Filled 2023-10-09: qty 20

## 2023-10-09 MED ORDER — ZOLPIDEM TARTRATE 5 MG PO TABS: 5.0000 mg | ORAL_TABLET | Freq: Every evening | ORAL | Status: DC | PRN

## 2023-10-09 MED ORDER — FENTANYL CITRATE (PF) 100 MCG/2ML IJ SOLN
INTRAMUSCULAR | Status: AC
  Filled 2023-10-09: qty 2

## 2023-10-09 MED ORDER — DIBUCAINE (PERIANAL) 1 % EX OINT: 1.0000 | TOPICAL_OINTMENT | CUTANEOUS | Status: DC | PRN

## 2023-10-09 MED ORDER — ACETAMINOPHEN 500 MG PO TABS
1000.0000 mg | ORAL_TABLET | Freq: Four times a day (QID) | ORAL | Status: DC
  Administered 2023-10-09 – 2023-10-12 (×12): 1000 mg via ORAL
  Filled 2023-10-09 (×11): qty 2

## 2023-10-09 MED ORDER — PHENYLEPHRINE HCL-NACL 20-0.9 MG/250ML-% IV SOLN
INTRAVENOUS | Status: AC
  Filled 2023-10-09: qty 250

## 2023-10-09 MED ORDER — PHENYLEPHRINE 80 MCG/ML (10ML) SYRINGE FOR IV PUSH (FOR BLOOD PRESSURE SUPPORT)
PREFILLED_SYRINGE | INTRAVENOUS | Status: DC | PRN
  Administered 2023-10-09 (×2): 80 ug via INTRAVENOUS

## 2023-10-09 SURGICAL SUPPLY — 32 items
BENZOIN TINCTURE PRP APPL 2/3 (GAUZE/BANDAGES/DRESSINGS) IMPLANT
CHLORAPREP W/TINT 26 (MISCELLANEOUS) ×2 IMPLANT
CLAMP UMBILICAL CORD (MISCELLANEOUS) ×1 IMPLANT
CLOTH BEACON ORANGE TIMEOUT ST (SAFETY) ×1 IMPLANT
DRSG OPSITE POSTOP 4X10 (GAUZE/BANDAGES/DRESSINGS) ×1 IMPLANT
ELECT REM PT RETURN 9FT ADLT (ELECTROSURGICAL) ×1
ELECTRODE REM PT RTRN 9FT ADLT (ELECTROSURGICAL) ×1 IMPLANT
EXTRACTOR VACUUM KIWI (MISCELLANEOUS) IMPLANT
GLOVE BIOGEL PI IND STRL 7.0 (GLOVE) ×1 IMPLANT
GLOVE SURG LTX SZ8 (GLOVE) ×1 IMPLANT
GOWN STRL REUS W/TWL LRG LVL3 (GOWN DISPOSABLE) ×2 IMPLANT
KIT ABG SYR 3ML LUER SLIP (SYRINGE) IMPLANT
MAT PREVALON FULL STRYKER (MISCELLANEOUS) IMPLANT
NDL HYPO 25X5/8 SAFETYGLIDE (NEEDLE) IMPLANT
NDL SPNL 20GX3.5 QUINCKE YW (NEEDLE) IMPLANT
NEEDLE HYPO 22GX1.5 SAFETY (NEEDLE) ×1 IMPLANT
NEEDLE HYPO 25X5/8 SAFETYGLIDE (NEEDLE) IMPLANT
NEEDLE SPNL 20GX3.5 QUINCKE YW (NEEDLE) IMPLANT
NS IRRIG 1000ML POUR BTL (IV SOLUTION) ×1 IMPLANT
PACK C SECTION WH (CUSTOM PROCEDURE TRAY) ×1 IMPLANT
STRIP CLOSURE SKIN 1/2X4 (GAUZE/BANDAGES/DRESSINGS) IMPLANT
SUT MNCRL 0 VIOLET CTX 36 (SUTURE) ×2 IMPLANT
SUT MNCRL AB 3-0 PS2 27 (SUTURE) IMPLANT
SUT MON AB 2-0 CT1 27 (SUTURE) ×1 IMPLANT
SUT MON AB-0 CT1 36 (SUTURE) ×2 IMPLANT
SUT PLAIN 0 NONE (SUTURE) IMPLANT
SUT PLAIN 2 0 XLH (SUTURE) IMPLANT
SUT PLAIN ABS 2-0 CT1 27XMFL (SUTURE) IMPLANT
SYR CONTROL 10ML LL (SYRINGE) ×1 IMPLANT
TOWEL OR 17X24 6PK STRL BLUE (TOWEL DISPOSABLE) ×1 IMPLANT
TRAY FOLEY W/BAG SLVR 14FR LF (SET/KITS/TRAYS/PACK) ×1 IMPLANT
WATER STERILE IRR 1000ML POUR (IV SOLUTION) ×1 IMPLANT

## 2023-10-09 NOTE — Lactation Note (Signed)
This note was copied from a baby's chart. Lactation Consultation Note  Patient Name: Megan [name] [id number]'[id number] Date: 10/09/2023 Age:28 hours Reason for consult: Initial assessment;Primapara;1st time breastfeeding;Term;RN request  P1- RN requested for [facility] to see MOB to work on latching infant. When LC entered room, MOB reported that infant had latched on his own and nursed consistently for 15 minutes. MOB denied any pain or discomfort. LC reviewed feeding positions, belly to belly position, nose to nipple starting and stroking the nipple from nose to chin to initiate a gaping mouth. LC encouraged MOB to call for further assistance and a latch assessment before discharge. MOB denies having any questions or concerns at this time.  LC reviewed the first 24 hr birthday nap, how to stimulate infant into waking up, feeding infant on cue 8-12x in 24 hrs, not allowing infant to go over 3 hrs without a feeding, CDC milk storage guidelines, LC services handout, difference between colostrum and mature milk and the difference between manual/hands free/DEBP. LC encouraged MOB to call for further assistance as needed.  Maternal Data Has patient been taught Hand Expression?: Yes Does the patient have breastfeeding experience prior to this delivery?: No  Feeding Mother's Current Feeding Choice: Breast Milk  Lactation Tools Discussed/Used Tools: Pump;Flanges Breast pump type: Manual Pump Education: Milk Storage;Setup, frequency, and cleaning Reason for Pumping: MOB request Pumping frequency: 15-20 min every 2-3 hrs as needed  Interventions Interventions: Breast feeding basics reviewed;Hand pump;Education;LC Services brochure  Discharge Discharge Education: Warning signs for feeding baby Pump: DEBP;Manual;Hands Free;Personal  Consult Status Consult Status: Follow-up Date: 10/10/23 Follow-up type: In-patient    Dema Severin BS, IBCLC 10/09/2023, 6:33 PM

## 2023-10-09 NOTE — Anesthesia Procedure Notes (Signed)
Spinal  Patient location during procedure: OR Start time: 10/09/2023 12:16 PM End time: 10/09/2023 12:18 PM Reason for block: surgical anesthesia Staffing Performed: anesthesiologist  Anesthesiologist: Val Eagle, MD Performed by: Val Eagle, MD Authorized by: Val Eagle, MD   Preanesthetic Checklist Completed: patient identified, IV checked, risks and benefits discussed, surgical consent, monitors and equipment checked, pre-op evaluation and timeout performed Spinal Block Patient position: sitting Prep: DuraPrep Patient monitoring: heart rate, cardiac monitor, continuous pulse ox and blood pressure Approach: midline Location: L3-4 Injection technique: single-shot Needle Needle type: Pencan  Needle gauge: 24 G Needle length: 9 cm Assessment Sensory level: T6 Events: CSF return

## 2023-10-09 NOTE — MAU Note (Signed)
Megan [name] is a 27 y.o. at [redacted]w[redacted]d here in MAU reporting: pt scheduled for primary c/s at 1230 today, was to arrive at 1030.  Reports SROM at 0800, clear fluid still coming, occ sees slight blood tinge.   Started having some contractions at 0900 Onset of complaint: 0800 Pain score: moderate There were no vitals filed for this visit.   WJX:BJYNWG triage, direct to rm.  Reports +FM Lab orders placed from triage:  fern

## 2023-10-09 NOTE — Anesthesia Preprocedure Evaluation (Signed)
Anesthesia Evaluation  Patient identified by MRN, date of birth, ID band Patient awake    Reviewed: Allergy & Precautions, Patient's Chart, lab work & pertinent test results  History of Anesthesia Complications Negative for: history of anesthetic complications  Airway Mallampati: III  TM Distance: >3 FB Neck ROM: Full    Dental  (+) Teeth Intact, Dental Advisory Given   Pulmonary neg pulmonary ROS, neg recent URI   breath sounds clear to auscultation       Cardiovascular negative cardio ROS  Rhythm:Regular     Neuro/Psych  PSYCHIATRIC DISORDERS Anxiety     negative neurological ROS     GI/Hepatic Neg liver ROS,GERD  Medicated and Controlled,,  Endo/Other  negative endocrine ROS    Renal/GU negative Renal ROS     Musculoskeletal   Abdominal   Peds  Hematology negative hematology ROS (+) Lab Results      Component                Value               Date                      WBC                      9.5                 10/07/2023                HGB                      12.5                10/07/2023                HCT                      37.9                10/07/2023                MCV                      86.3                10/07/2023                PLT                      197                 10/07/2023          \    Anesthesia Other Findings   Reproductive/Obstetrics (+) Pregnancy                             Anesthesia Physical Anesthesia Plan  ASA: 2  Anesthesia Plan: MAC and Spinal   Post-op Pain Management: Ofirmev IV (intra-op)* and Toradol IV (intra-op)*   Induction:   PONV Risk Score and Plan: 2 and Ondansetron  Airway Management Planned: Nasal Cannula, Natural Airway and Simple Face Mask  Additional Equipment: None  Intra-op Plan:   Post-operative Plan:   Informed Consent:      Dental advisory given  Plan Discussed with: CRNA  Anesthesia Plan Comments:  Anesthesia Quick Evaluation

## 2023-10-09 NOTE — Transfer of Care (Cosign Needed)
Immediate Anesthesia Transfer of Care Note  Patient: Megan [name]  Procedure(s) Performed: Primary CESAREAN SECTION  Patient Location: PACU  Anesthesia Type:Spinal  Level of Consciousness: awake, alert , and oriented  Airway & Oxygen Therapy: Patient Spontanous Breathing  Post-op Assessment: Report given to RN and Post -op Vital signs reviewed and stable  Post vital signs: Reviewed and stable  Last Vitals:  Vitals Value Taken Time  BP 100/64 10/09/23 1325  Temp    Pulse 76 10/09/23 1328  Resp 17 10/09/23 1328  SpO2 100 % 10/09/23 1328  Vitals shown include unfiled device data.  Last Pain:  Vitals:   10/09/23 1108  TempSrc: Oral  PainSc: 0-No pain         Complications: No notable events documented.

## 2023-10-09 NOTE — Social Work (Signed)
MOB was referred for history of anxiety.  * Referral screened out by Clinical Social Worker because none of the following criteria appear to apply: ~ History of anxiety/depression during this pregnancy, or of post-partum depression following prior delivery. ~ Diagnosis of anxiety and/or depression within last 3 years. Per chart review, MOB diagnosed with anxiety in 2016. There were no mental health concerns noted in the [facility] medical record.  OR * MOB's symptoms currently being treated with medication and/or therapy.  Please contact the Clinical Social Worker if needs arise, by [facility] request, or if MOB scores greater than 9/yes to question 10 on Edinburgh Postpartum Depression Screen.  Vivi Barrack, MSW, LCSW Clinical Social Worker  [phone] 10/09/2023  2:50 PM

## 2023-10-09 NOTE — Op Note (Signed)
Cesarean Section Procedure Note  Indications: patient declines vag del attempt History of pelvic fracture  Pre-operative Diagnosis: 39 week 0 day pregnancy.  Post-operative Diagnosis: same  Surgeon: Lenoard Aden   Assistants: Yetta Barre, CNM  Anesthesia: Local anesthesia 0.25.% bupivacaine and Spinal anesthesia  ASA Class: 2  Procedure Details  The patient was seen in the Holding Room. The risks, benefits, complications, treatment options, and expected outcomes were discussed with the patient.  The patient concurred with the proposed plan, giving informed consent. The risks of anesthesia, infection, bleeding and possible injury to other organs discussed. Injury to bowel, bladder, or ureter with possible need for repair discussed. Possible need for transfusion with secondary risks of hepatitis or HIV acquisition discussed. Post operative complications to include but not limited to DVT, PE and Pneumonia noted. The site of surgery properly noted/marked. The patient was taken to Operating Room # C, identified as Madlen Ramdial and the procedure verified as C-Section Delivery. A Time Out was held and the above information confirmed.  After induction of anesthesia, the patient was draped and prepped in the usual sterile manner. A Pfannenstiel incision was made and carried down through the subcutaneous tissue to the fascia. Fascial incision was made and extended transversely using Mayo scissors. The fascia was separated from the underlying rectus tissue superiorly and inferiorly. The peritoneum was identified and entered. Peritoneal incision was extended longitudinally. The utero-vesical peritoneal reflection was incised transversely and the bladder flap was bluntly freed from the lower uterine segment. A low transverse uterine incision(Kerr hysterotomy) was made. Delivered from OA presentation was a  female with Apgar scores of 9 at one minute and 9 at five minutes. Bulb suctioning gently performed.  Neonatal team in attendance.After the umbilical cord was clamped and cut cord blood was obtained for evaluation. The placenta was removed intact and appeared normal. The uterus was curetted with a dry lap pack. Good hemostasis was noted.The uterine outline, tubes and ovaries appeared normal. The uterine incision was closed with running locked sutures of 0 Monocryl x 1 layers. Hemostasis was observed. The fascia was then reapproximated with running sutures of 0 Monocryl. The skin was reapproximated with 3-0 monocryl after Garfield closure with 2-0 plain interrupted.  Instrument, sponge, and needle counts were correct prior the abdominal closure and at the conclusion of the case.   Findings: FTLM, OA, anterior placenta. Nl adnexa  Estimated Blood Loss:  300 mL         Drains: foley                 Specimens: placenat                 Complications:  None; patient tolerated the procedure well.         Disposition: PACU - hemodynamically stable.         Condition: stable  Attending Attestation: I performed the procedure.

## 2023-10-10 LAB — COMPREHENSIVE METABOLIC PANEL
ALT: 43 U/L (ref 0–44)
AST: 48 U/L — ABNORMAL HIGH (ref 15–41)
Albumin: 2.3 g/dL — ABNORMAL LOW (ref 3.5–5.0)
Alkaline Phosphatase: 144 U/L — ABNORMAL HIGH (ref 38–126)
Anion gap: 5 (ref 5–15)
BUN: 11 mg/dL (ref 6–20)
CO2: 27 mmol/L (ref 22–32)
Calcium: 8.6 mg/dL — ABNORMAL LOW (ref 8.9–10.3)
Chloride: 108 mmol/L (ref 98–111)
Creatinine, Ser: 0.55 mg/dL (ref 0.44–1.00)
GFR, Estimated: >60 mL/min (ref >60)
Glucose, Bld: 75 mg/dL (ref 70–99)
Potassium: 3.8 mmol/L (ref 3.5–5.1)
Sodium: 140 mmol/L (ref 135–145)
Total Bilirubin: 0.5 mg/dL (ref <1.2)
Total Protein: 5.6 g/dL — ABNORMAL LOW (ref 6.5–8.1)

## 2023-10-10 LAB — CBC
HCT: 33 % — ABNORMAL LOW (ref 36.0–46.0)
Hemoglobin: 10.9 g/dL — ABNORMAL LOW (ref 12.0–15.0)
MCH: 28.9 pg (ref 26.0–34.0)
MCHC: 33 g/dL (ref 30.0–36.0)
MCV: 87.5 fL (ref 80.0–100.0)
Platelets: 186 10*3/uL (ref 150–400)
RBC: 3.77 MIL/uL — ABNORMAL LOW (ref 3.87–5.11)
RDW: 14.5 % (ref 11.5–15.5)
WBC: 10.6 10*3/uL — ABNORMAL HIGH (ref 4.0–10.5)
nRBC: 0 % (ref 0.0–0.2)

## 2023-10-10 NOTE — Lactation Note (Signed)
This note was copied from a baby's chart. Lactation Consultation Note  Patient Name: Megan [name] [id number] Date: 10/10/2023 Age:28 hours Reason for consult: Follow-up assessment;Primapara;Term Mom stated BF going well but it hurts some. LC discussed feeding positions. Mom discussed how she has been BF baby. Suggested how to flange lips. Asked mom to call for latch assistance for next feeding.  Maternal Data Has patient been taught Hand Expression?: Yes Does the patient have breastfeeding experience prior to this delivery?: No  Feeding    LATCH Score       Type of Nipple: Everted at rest and after stimulation  Comfort (Breast/Nipple): Filling, red/small blisters or bruises, mild/mod discomfort (sore)         Lactation Tools Discussed/Used    Interventions    Discharge    Consult Status Consult Status: Follow-up Date: 10/11/23 Follow-up type: In-patient    Charyl Dancer 10/10/2023, 11:39 PM

## 2023-10-10 NOTE — Progress Notes (Signed)
Subjective: Postpartum Day One: Cesarean Delivery Patient reports doing well. She has been up and ambulating on unit without dizziness or difficulties. Pain has been well controlled. VB has been minimal. Tolerating a regular diet without N/V. Spontaneously voiding without difficulties and passing gas. No CP, HA, or SOB.  Baby boy doing well at bedside, breastfeeding is going well. Declines circumcision.  Objective: Patient Vitals for the past 24 hrs:  BP Temp Temp src Pulse Resp SpO2  10/10/23 1201 104/62 97.7 F (36.5 C) Oral 71 18 100 %  10/10/23 0616 101/62 98.2 F (36.8 C) Oral 78 18 100 %  10/10/23 0247 93/64 98.4 F (36.9 C) Oral 60 18 100 %  10/09/23 2247 104/60 98.4 F (36.9 C) Oral 80 18 98 %  10/09/23 1843 109/69 98.3 F (36.8 C) Oral 73 16 99 %  10/09/23 1726 105/63 98.5 F (36.9 C) Oral 68 16 99 %  10/09/23 1553 91/64 98.7 F (37.1 C) Oral 92 16 100 %  10/09/23 1458 107/68 98.3 F (36.8 C) Oral 69 14 100 %  10/09/23 1443 (!) 104/54 -- -- 68 13 --  10/09/23 1430 102/71 97.9 F (36.6 C) Axillary 68 (!) 23 (!) 87 %  10/09/23 1415 (!) 103/90 -- -- 69 16 99 %  10/09/23 1400 109/66 97.7 F (36.5 C) Axillary 64 18 100 %  10/09/23 1345 (!) 132/101 -- -- 80 13 98 %  10/09/23 1330 104/74 -- -- 73 19 --  10/09/23 1325 100/64 (!) 97.5 F (36.4 C) Oral 77 14 97 %    Intake/Output Summary (Last 24 hours) at 10/10/2023 1220 Last data filed at 10/10/2023 4696 Gross per 24 hour  Intake 1000 ml  Output 3822 ml  Net -2822 ml     Physical Exam:  General: alert and no distress Lochia: appropriate Uterine Fundus: firm Incision: healing well, no significant drainage DVT Evaluation: No evidence of DVT seen on physical exam.  No results for input(s): "HGB", "HCT" in the last 72 hours.  Assessment/Plan: Status post Cesarean section. Doing well postoperatively.  Continue current care. Megan [name] Megan [name] POD#1 sp primary cesarean section at [redacted]w[redacted]d 1. PPC: cont  current PO pain regimen, regular diet as tolerated, encourage ambulation 2. POD1 labs pending 3. LC support PRN 4. RH POS  Plan for discharge home tomorrow POD2  Megan [name] A Megan [name] 10/10/2023, 12:18 PM

## 2023-10-11 ENCOUNTER — Encounter (HOSPITAL_COMMUNITY): Payer: Self-pay | Admitting: Obstetrics and Gynecology

## 2023-10-11 MED ORDER — OXYCODONE HCL 5 MG PO TABS: 5.0000 mg | ORAL_TABLET | ORAL | 0 refills | Status: AC | PRN

## 2023-10-11 MED ORDER — ACETAMINOPHEN 500 MG PO TABS: 1000.0000 mg | ORAL_TABLET | Freq: Four times a day (QID) | ORAL | 0 refills | Status: AC

## 2023-10-11 MED ORDER — IBUPROFEN 600 MG PO TABS: 600.0000 mg | ORAL_TABLET | Freq: Four times a day (QID) | ORAL | 0 refills | Status: AC

## 2023-10-11 NOTE — Anesthesia Postprocedure Evaluation (Signed)
Anesthesia Post Note  Patient: Megan [name]  Procedure(s) Performed: Primary CESAREAN SECTION     Patient location during evaluation: PACU Anesthesia Type: MAC and Spinal Level of consciousness: awake and alert Pain management: pain level controlled Vital Signs Assessment: post-procedure vital signs reviewed and stable Respiratory status: spontaneous breathing, nonlabored ventilation and respiratory function stable Cardiovascular status: stable and blood pressure returned to baseline Postop Assessment: no apparent nausea or vomiting Anesthetic complications: no   No notable events documented.               Brookes Craine

## 2023-10-11 NOTE — Discharge Summary (Signed)
Postpartum Discharge Summary  Date of Service updated 10/11/23     Patient Name: Megan [name] DOB: 07-31-1996 MRN: [id number]  Date of admission: 10/09/2023 Delivery date:10/09/2023 Delivering provider: Olivia Mackie Date of discharge: 10/11/2023  Admitting diagnosis: Pelvic floor dysfunction in female [M62.89] History of pelvic fracture [Z87.81] Intrauterine pregnancy: [redacted]w[redacted]d     Secondary diagnosis:  Principal Problem:   Postpartum care following cesarean delivery 12/20 Active Problems:   Generalized anxiety disorder   Status post primary low transverse cesarean section   Pelvic floor dysfunction in female   History of pelvic fracture  Additional problems: n/a    Discharge diagnosis: Term Pregnancy Delivered                                              Post partum procedures: b/a Augmentation: N/A Complications: None  Hospital course: Sceduled C/S   27 y.o. yo G1P1001 at [redacted]w[redacted]d was admitted to the hospital 10/09/2023 for scheduled cesarean section with the following indication:Elective Primary.Delivery details are as follows:  Membrane Rupture Time/Date: 8:00 AM,10/09/2023  Delivery Method:C-Section, Low Transverse Operative Delivery:N/A Details of operation can be found in separate operative note.  Patient had a postpartum course complicated by none.  She is ambulating, tolerating a regular diet, passing flatus, and urinating well. Patient is discharged home in stable condition on  10/11/23        Newborn Data: Birth date:10/09/2023 Birth time:12:38 PM Gender:Female Living status:Living Apgars:9 ,9  Weight:3460 g    Magnesium Sulfate received: No BMZ received: No Rhophylac:N/A MMR:N/A  Immunizations administered: Immunization History  Administered Date(s) Administered   DTaP 10/06/1995, 12/24/1995, 02/23/1996, 07/14/1997, 08/22/1999   HIB (PRP-OMP) 10/06/1995, 12/24/1995, 11/02/1996, 07/14/1997   HPV Quadrivalent 02/23/2013   Hepatitis A  07/15/2007, 02/23/2013   Hepatitis B 10/22/94, 10/06/1995, 05/03/1996   IPV 10/06/1995, 12/24/1995, 02/23/1996, 05/11/2000   Janssen (J&J) SARS-COV-2 Vaccination 12/29/2019   MMR 08/10/1996, 05/11/2000   Meningococcal polysaccharide vaccine (MPSV4) 02/23/2013   Pneumococcal Conjugate-13 08/22/1999   Tdap 07/15/2007, 04/24/2013   Varicella 08/10/1996, 02/23/2013    Physical exam  Vitals:   10/10/23 0247 10/10/23 0616 10/10/23 1201 10/11/23 0515  BP: 93/64 101/62 104/62 (!) 103/58  Pulse: 60 78 71 87  Resp: 18 18 18 16   Temp: 98.4 F (36.9 C) 98.2 F (36.8 C) 97.7 F (36.5 C) 98.5 F (36.9 C)  TempSrc: Oral Oral Oral Oral  SpO2: 100% 100% 100% 98%  Weight:      Height:       General: alert and no distress Lochia: appropriate Uterine Fundus: firm Incision: Healing well with no significant drainage DVT Evaluation: No evidence of DVT seen on physical exam. Labs: Lab Results  Component Value Date   WBC 10.6 (H) 10/10/2023   HGB 10.9 (L) 10/10/2023   HCT 33.0 (L) 10/10/2023   MCV 87.5 10/10/2023   PLT 186 10/10/2023      Latest Ref Rng & Units 10/10/2023    1:04 PM  CMP  Glucose 70 - 99 mg/dL 75   BUN 6 - 20 mg/dL 11   Creatinine 4.09 - 1.00 mg/dL 8.11   Sodium 914 - 782 mmol/L 140   Potassium 3.5 - 5.1 mmol/L 3.8   Chloride 98 - 111 mmol/L 108   CO2 22 - 32 mmol/L 27   Calcium 8.9 - 10.3 mg/dL 8.6   Total  Protein 6.5 - 8.1 g/dL 5.6   Total Bilirubin <7.8 mg/dL 0.5   Alkaline Phos 38 - 126 U/L 144   AST 15 - 41 U/L 48   ALT 0 - 44 U/L 43    Edinburgh Score:    10/09/2023    5:26 PM  Edinburgh Postnatal Depression Scale Screening Tool  I have been able to laugh and see the funny side of things. 0  I have looked forward with enjoyment to things. 0  I have blamed myself unnecessarily when things went wrong. 1  I have been anxious or worried for no good reason. 2  I have felt scared or panicky for no good reason. 1  Things have been getting on top of me. 0   I have been so unhappy that I have had difficulty sleeping. 0  I have felt sad or miserable. 0  I have been so unhappy that I have been crying. 0  The thought of harming myself has occurred to me. 0  Edinburgh Postnatal Depression Scale Total 4      After visit meds:  Allergies as of 10/11/2023       Reactions   Nitrofurantoin Hives, Rash, Swelling   Swollen lip and rash   Sulfa Antibiotics Hives   Tingling tongue        Medication List     TAKE these medications    acetaminophen 500 MG tablet Commonly known as: TYLENOL Take 2 tablets (1,000 mg total) by mouth every 6 (six) hours.   cetirizine 10 MG chewable tablet Commonly known as: ZYRTEC Chew 10 mg by mouth daily.   docusate sodium 100 MG capsule Commonly known as: COLACE Take 300 mg by mouth daily.   fluticasone 50 MCG/ACT nasal spray Commonly known as: FLONASE Place 1 spray into both nostrils daily.   ibuprofen 600 MG tablet Commonly known as: ADVIL Take 1 tablet (600 mg total) by mouth every 6 (six) hours.   omeprazole 20 MG tablet Commonly known as: PriLOSEC OTC Take 1 tablet (20 mg total) by mouth daily. Take 30 minutes prior to other food or medications What changed:  how much to take when to take this   oxyCODONE 5 MG immediate release tablet Commonly known as: Oxy IR/ROXICODONE Take 1-2 tablets (5-10 mg total) by mouth every 4 (four) hours as needed for moderate pain (pain score 4-6).   Pepcid AC Maximum Strength 20 MG tablet Generic drug: famotidine Take 20 mg by mouth at bedtime.   POLYETHYLENE GLYCOL 3350 PO Take 1-2 Capfuls by mouth daily as needed for moderate constipation or mild constipation.   PRENATAL COMPLETE PO Take 1 tablet by mouth daily.   senna 8.6 MG Tabs tablet Commonly known as: SENOKOT TAKE 1 TABLET(8.6 MG) BY MOUTH DAILY               Discharge Care Instructions  (From admission, onward)           Start     Ordered   10/11/23 0000  Discharge wound  care:       Comments: May remove Honeycomb dressing by 1 week postpartum. The underlying Steri Strips may be removed by 2 weeks postpartum. Keep incision clean and dry.   10/11/23 0933             Discharge home in stable condition Infant Feeding: Breast Infant Disposition:home with mother Discharge instruction: per After Visit Summary and Postpartum booklet. Activity: Advance as tolerated. Pelvic rest for 6 weeks.  Diet: routine diet Anticipated Birth Control: Unsure Postpartum Appointment:6 weeks Additional Postpartum F/U:  n/a Future Appointments: Future Appointments  Date Time Provider Department Center  12/16/2023 11:00 AM Zehr, Princella Pellegrini, PA-C [facility]   Follow up Visit:  Patient to follow up with Dr. Billy Coast at [facility] for 6 week postpartum visit or sooner as needed.    10/11/2023 Ahlivia Salahuddin A Farran Amsden, DO   Patient cancelled d/c yesterday; now ready for d/c; uneventful stay overnight; plan d/c home with 2 wk f/u for mood

## 2023-10-11 NOTE — Lactation Note (Signed)
This note was copied from a baby's chart. Lactation Consultation Note  Patient Name: Megan [name] Date: 10/11/2023 Age:28 hours Reason for consult: Follow-up assessment;Mother's request;MD order;Primapara;1st time breastfeeding;Term;Infant weight loss;Nipple pain/trauma;Breastfeeding assistance  P1- Pediatric NP Heather requested for LC to assist with latch because MOB is having some concerns before discharge. MOB reports that it is very painful when infant latches and she has been flanging his lips but it does not work. LC offered to assist with the feeding after an oral exam. MOB verbalized agreement. When [facility] offered a gloved finger, LC noted that infant was mostly chomping instead of sucking. LC also noted that when he is chomping, his bottom gum is pinching the tissue in his mouth. LC reviewed findings with MOB. LC offered preventative and supportive care to help her with the pain until infant learns to nurse correctly. LC encouraged the use of a pacifier for oral motor stimulation. LC also encouraged MOB to use a hydrogel pad and coconut oil for breast care. MOB's mom encouraged the use of a nipple shield to protect the nipple and provide cushion. LC provided this as well and gave instructions on it's usage.  LC assisted MOB with placing infant on the right breast in the cross cradle hold. Infant latched after a few attempts, but needed his lips flanged. LC noted consistent chomping rather than nutritive sucking. After Infant's lips were flanged, MOB reported some comfort. MOB was still in a lot of pain though and started tearing up. LC offered to take infant off of the breast, but MOB refused and allowed infant to nurse for 15 minutes.  LC reviewed CDC milk storage guidelines, LC services handout and engorgement/breast care. LC encouraged MOB to call for further assistance as needed.  Maternal Data Has patient been taught Hand Expression?: No Does the patient have breastfeeding  experience prior to this delivery?: No  Feeding Mother's Current Feeding Choice: Breast Milk  LATCH Score Latch: Repeated attempts needed to sustain latch, nipple held in mouth throughout feeding, stimulation needed to elicit sucking reflex.  Audible Swallowing: A few with stimulation  Type of Nipple: Everted at rest and after stimulation  Comfort (Breast/Nipple): Engorged, cracked, bleeding, large blisters, severe discomfort  Hold (Positioning): Assistance needed to correctly position infant at breast and maintain latch.  LATCH Score: 5   Lactation Tools Discussed/Used Tools: Pump;Flanges;Coconut oil;Nipple Shields;Comfort gels Nipple shield size: 16;20 Flange Size:  (MOB needs size 16-18 flanges) Breast pump type: Manual Pump Education: Setup, frequency, and cleaning;Milk Storage Pumping frequency: as needed  Interventions Interventions: Breast feeding basics reviewed;Assisted with latch;Breast compression;Adjust position;Support pillows;Position options;Comfort gels;Education;LC Services brochure  Discharge Discharge Education: Engorgement and breast care;Warning signs for feeding baby;Outpatient recommendation Pump: DEBP;Manual;Hands Free;Personal  Consult Status Consult Status: Complete Date: 10/11/23    Dema Severin BS, IBCLC 10/11/2023, 1:57 PM

## 2023-10-12 NOTE — Progress Notes (Signed)
Patient decided against discharge yesterday No c/o, pain controlled, voids w/o difficulty; nml lochia +flatus, ambulating Breastfeeding  Stayed overnight as she felt she wasn't ready - Pt felt anxious, crying often, says better now but sx persist; h/o anxiety, has never been on meds; tearful during conversation with me  Patient Vitals for the past 24 hrs:  BP Temp Temp src Pulse Resp SpO2  10/12/23 0525 (!) 110/59 98.4 F (36.9 C) Oral 78 16 100 %  10/11/23 2141 106/66 98.3 F (36.8 C) Oral 78 16 100 %  10/11/23 1505 105/60 98.7 F (37.1 C) Oral 84 16 99 %   A&ox3 Nml respirations Abd: soft,nt,nd, +bs; fundus firm and below umb; inc: c/d/I LE: no edema,nt bilat     Latest Ref Rng & Units 10/10/2023    1:04 PM 10/07/2023   10:30 AM 07/13/2023   10:52 PM  CBC  WBC 4.0 - 10.5 K/uL 10.6  9.5  7.8   Hemoglobin 12.0 - 15.0 g/dL 16.1  09.6  04.5   Hematocrit 36.0 - 46.0 % 33.0  37.9  34.9   Platelets 150 - 400 K/uL 186  197  197    A/P: pod 3 s/p ltcs Doing well, d/c home today; f/u office 6 wks RH pos RI Mild acute anemia, asymptomatic, significant - plan iron daily 5. Breastfeeding, female 6. H/o anxiety - reviewed signs of PP depression - patient may benefit from meds; monitor closely at home and husband very aware of what to look for, how to help minimize stressors; plan f/u 2 wks in office, offered sooner visit but declines; precautions reveiwed

## 2023-10-16 ENCOUNTER — Telehealth (HOSPITAL_COMMUNITY): Payer: Self-pay | Admitting: *Deleted

## 2023-10-16 LAB — BIRTH TISSUE RECOVERY COLLECTION (PLACENTA DONATION)

## 2023-10-16 NOTE — Telephone Encounter (Signed)
10/16/2023  Name: Megan [name] MRN: [id number] DOB: May 14, 1996  Reason for Call:  Transition of Care Hospital Discharge Call  Contact Status: Patient Contact Status: Message  Language assistant needed:          Follow-Up Questions:    Inocente Salles Postnatal Depression Scale:  In the Past 7 Days:    PHQ2-9 Depression Scale:     Discharge Follow-up:    Post-discharge interventions: NA  Salena Saner, RN 10/16/2023 16:02

## 2023-12-16 ENCOUNTER — Ambulatory Visit: Payer: 59 | Admitting: Gastroenterology
